# Patient Record
Sex: Female | Born: 1970 | Race: Black or African American | Hispanic: No | Marital: Single | Smoking: Former smoker
Health system: Southern US, Community
[De-identification: ages and names within clinical notes are randomized; demographics above are authoritative.]

## PROBLEM LIST (undated history)

## (undated) DIAGNOSIS — Z8719 Personal history of other diseases of the digestive system: Secondary | ICD-10-CM

## (undated) DIAGNOSIS — K219 Gastro-esophageal reflux disease without esophagitis: Secondary | ICD-10-CM

## (undated) DIAGNOSIS — E6609 Other obesity due to excess calories: Secondary | ICD-10-CM

## (undated) DIAGNOSIS — F32A Depression, unspecified: Secondary | ICD-10-CM

## (undated) DIAGNOSIS — R519 Headache, unspecified: Secondary | ICD-10-CM

## (undated) DIAGNOSIS — Z8679 Personal history of other diseases of the circulatory system: Secondary | ICD-10-CM

## (undated) DIAGNOSIS — Z8711 Personal history of peptic ulcer disease: Secondary | ICD-10-CM

## (undated) DIAGNOSIS — F419 Anxiety disorder, unspecified: Secondary | ICD-10-CM

## (undated) DIAGNOSIS — E559 Vitamin D deficiency, unspecified: Secondary | ICD-10-CM

## (undated) DIAGNOSIS — J302 Other seasonal allergic rhinitis: Secondary | ICD-10-CM

## (undated) DIAGNOSIS — F329 Major depressive disorder, single episode, unspecified: Secondary | ICD-10-CM

## (undated) DIAGNOSIS — J45909 Unspecified asthma, uncomplicated: Secondary | ICD-10-CM

## (undated) DIAGNOSIS — F321 Major depressive disorder, single episode, moderate: Secondary | ICD-10-CM

## (undated) HISTORY — DX: Gastro-esophageal reflux disease without esophagitis: K21.9

## (undated) HISTORY — DX: Other seasonal allergic rhinitis: J30.2

## (undated) HISTORY — DX: Other obesity due to excess calories: E66.09

## (undated) HISTORY — DX: Unspecified asthma, uncomplicated: J45.909

## (undated) HISTORY — DX: Personal history of other diseases of the circulatory system: Z86.79

## (undated) HISTORY — DX: Vitamin D deficiency, unspecified: E55.9

## (undated) HISTORY — DX: Depression, unspecified: F32.A

## (undated) HISTORY — DX: Personal history of peptic ulcer disease: Z87.11

## (undated) HISTORY — PX: TOTAL ABDOMINAL HYSTERECTOMY: SHX209

## (undated) HISTORY — DX: Headache, unspecified: R51.9

## (undated) HISTORY — DX: Anxiety disorder, unspecified: F41.9

## (undated) HISTORY — DX: Major depressive disorder, single episode, moderate: F32.1

## (undated) HISTORY — PX: TUBAL LIGATION: SHX77

---
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
## 2018-05-27 ENCOUNTER — Ambulatory Visit: Payer: Self-pay | Admitting: Nurse Practitioner

## 2018-05-27 ENCOUNTER — Encounter: Payer: Self-pay | Admitting: Nurse Practitioner

## 2018-05-27 VITALS — BP 110/80 | HR 78 | Temp 99.3°F | Wt 191.2 lb

## 2018-05-27 DIAGNOSIS — N3001 Acute cystitis with hematuria: Secondary | ICD-10-CM

## 2018-05-27 DIAGNOSIS — R8290 Unspecified abnormal findings in urine: Secondary | ICD-10-CM

## 2018-05-27 LAB — POC URINALSYSI DIPSTICK (AUTOMATED)
Bilirubin, UA: NEGATIVE
Glucose, UA: NEGATIVE
KETONES UA: NEGATIVE
PH UA: 6 (ref 5.0–8.0)
PROTEIN UA: NEGATIVE
Urobilinogen, UA: 0.2 E.U./dL

## 2018-05-27 MED ORDER — PHENAZOPYRIDINE HCL 100 MG PO TABS: 100.0000 mg | ORAL_TABLET | Freq: Three times a day (TID) | ORAL | 0 refills | Status: AC | PRN

## 2018-05-27 MED ORDER — NITROFURANTOIN MONOHYD MACRO 100 MG PO CAPS: 100.0000 mg | ORAL_CAPSULE | Freq: Two times a day (BID) | ORAL | 0 refills | Status: AC

## 2018-05-27 NOTE — Progress Notes (Signed)
Subjective:    Rose Douglas is a 47 y.o. female who complains of burning with urination, frequency, hematuria, hesitancy, nausea, suprapubic pressure and urgency for 5 days.  Patient also complains of back pain. Patient denies congestion, cough, fever, headache, stomach ache and vaginal discharge.  Patient states she did have sexual intercourse preceding the onset of her symptoms.  Patient does not have a history of recurrent UTI.  Patient does not have a history of pyelonephritis.  The patient states she is taken a medicine similar to Azo, she could not recall the name, and ibuprofen for pain.  The patient states that medicine has helped.   The following portions of the patient's history were reviewed and updated as appropriate: allergies, current medications and past medical history.  Review of Systems Constitutional: positive for anorexia, chills and fatigue, negative for fevers, malaise and sweats Ears, nose, mouth, throat, and face: negative Respiratory: negative Cardiovascular: negative Gastrointestinal: positive for nausea, negative for abdominal pain, constipation, diarrhea and vomiting Genitourinary:positive for dysuria, frequency, hematuria and hesitancy, negative for vaginal discharge    Objective:    BP 110/80   Pulse 78   Temp 99.3 F (37.4 C)   Wt 191 lb 3.2 oz (86.7 kg)   SpO2 98%  General: alert, cooperative and no distress  Abdomen: soft, non-tender, without masses or organomegaly in the entire abdomen  Back: back muscles are full ROM, CVA tenderness absent  GU: defer exam   Laboratory:  Urine dipstick shows sp gravity 1.030, negative for glucose, trace for hemoglobin, negative for ketones, large for leukocyte esterase, trace for nitrites, negative for protein and 0.2 for urobilinogen.   Micro exam: not done.    Assessment:    Acute Cystitis with Hematuria    Plan:  Exam findings, diagnosis etiology and medication use and indications reviewed with  patient. Follow- Up and discharge instructions provided. No emergent/urgent issues found on exam.  Patient verbalized understanding of information provided and agrees with plan of care (POC), all questions answered.  Acute Cystitis with Hematuria -Nitrofurantoin 100mg  twice daily for 5 days. -Pyridium 100mg  three times daily for dysuria. -Ibuprofen for pain, fever or discomfort. -Increase fluids. -Avoid caffeine, teas, sodas and coffee. -Toileting schedule -Avoid tight fitting pants. -Follow up if symptoms do not improve. Meds ordered this encounter  Medications  . nitrofurantoin, macrocrystal-monohydrate, (MACROBID) 100 MG capsule    Sig: Take 1 capsule (100 mg total) by mouth 2 (two) times daily for 5 days.    Dispense:  10 capsule    Refill:  0    Order Specific Question:   Supervising Provider    Answer:   Stacie GlazeJENKINS, JOHN E [[id number]]  . phenazopyridine (PYRIDIUM) 100 MG tablet    Sig: Take 1 tablet (100 mg total) by mouth 3 (three) times daily as needed for up to 3 days for pain.    Dispense:  10 tablet    Refill:  0    Order Specific Question:   Supervising Provider    Answer:   Stacie GlazeJENKINS, JOHN E [phone][[id number]]

## 2018-05-27 NOTE — Patient Instructions (Signed)

## 2018-06-10 ENCOUNTER — Other Ambulatory Visit: Payer: Self-pay | Admitting: Nurse Practitioner

## 2018-06-10 MED ORDER — CIPROFLOXACIN HCL 250 MG PO TABS: 250.0000 mg | ORAL_TABLET | Freq: Two times a day (BID) | ORAL | 0 refills | Status: AC

## 2018-06-10 NOTE — Progress Notes (Signed)
Patient stating she still has UTI symptoms.  Informed patient we will send in another prescription for her symptoms.  Will call in Cipro 250 mg twice daily for 3 days.

## 2018-11-28 ENCOUNTER — Other Ambulatory Visit: Payer: Self-pay

## 2018-11-28 ENCOUNTER — Encounter (HOSPITAL_COMMUNITY): Payer: Self-pay

## 2018-11-28 ENCOUNTER — Emergency Department (HOSPITAL_COMMUNITY): Payer: 59

## 2018-11-28 ENCOUNTER — Emergency Department (HOSPITAL_COMMUNITY)
Admission: EM | Admit: 2018-11-28 | Discharge: 2018-11-28 | Disposition: A | Discharge status: Home / Self Care | Payer: 59 | Attending: Emergency Medicine | Admitting: Emergency Medicine

## 2018-11-28 DIAGNOSIS — N390 Urinary tract infection, site not specified: Secondary | ICD-10-CM

## 2018-11-28 DIAGNOSIS — F17210 Nicotine dependence, cigarettes, uncomplicated: Secondary | ICD-10-CM | POA: Diagnosis not present

## 2018-11-28 DIAGNOSIS — R079 Chest pain, unspecified: Secondary | ICD-10-CM | POA: Diagnosis not present

## 2018-11-28 LAB — BASIC METABOLIC PANEL
Anion gap: 8 (ref 5–15)
BUN: 11 mg/dL (ref 6–20)
CO2: 22 mmol/L (ref 22–32)
Calcium: 8.9 mg/dL (ref 8.9–10.3)
Chloride: 105 mmol/L (ref 98–111)
Creatinine, Ser: 0.85 mg/dL (ref 0.44–1.00)
GFR calc Af Amer: >60 mL/min (ref >60)
GFR calc non Af Amer: >60 mL/min (ref >60)
GLUCOSE: 90 mg/dL (ref 70–99)
Potassium: 4.3 mmol/L (ref 3.5–5.1)
Sodium: 135 mmol/L (ref 135–145)

## 2018-11-28 LAB — URINALYSIS, ROUTINE W REFLEX MICROSCOPIC
BILIRUBIN URINE: NEGATIVE
Glucose, UA: NEGATIVE mg/dL
Hgb urine dipstick: NEGATIVE
Ketones, ur: NEGATIVE mg/dL
Nitrite: NEGATIVE
Protein, ur: NEGATIVE mg/dL
Specific Gravity, Urine: 1.009 (ref 1.005–1.030)
pH: 6 (ref 5.0–8.0)

## 2018-11-28 LAB — CBC
HCT: 41.7 % (ref 36.0–46.0)
Hemoglobin: 13.2 g/dL (ref 12.0–15.0)
MCH: 23.5 pg — ABNORMAL LOW (ref 26.0–34.0)
MCHC: 31.7 g/dL (ref 30.0–36.0)
MCV: 74.3 fL — ABNORMAL LOW (ref 80.0–100.0)
Platelets: 207 10*3/uL (ref 150–400)
RBC: 5.61 MIL/uL — ABNORMAL HIGH (ref 3.87–5.11)
RDW: 15.1 % (ref 11.5–15.5)
WBC: 6.4 10*3/uL (ref 4.0–10.5)
nRBC: 0 % (ref 0.0–0.2)

## 2018-11-28 LAB — I-STAT BETA HCG BLOOD, ED (MC, WL, AP ONLY): I-stat hCG, quantitative: <5 m[IU]/mL (ref <5)

## 2018-11-28 LAB — I-STAT TROPONIN, ED: Troponin i, poc: 0 ng/mL (ref 0.00–0.08)

## 2018-11-28 MED ORDER — CEPHALEXIN 500 MG PO CAPS: 500.0000 mg | ORAL_CAPSULE | Freq: Four times a day (QID) | ORAL | 0 refills | Status: DC

## 2018-11-28 NOTE — ED Provider Notes (Signed)
[facility] EMERGENCY [facility]PARTMENT Provider Note   CSN: [id number] Arrival date & time: 11/28/18  1156     History   Chief Complaint Chief Complaint  Patient presents with  . Chest Pain    HPI Rose Douglas is a 48 y.o. female.  The history is provided by the patient. No language interpreter was used.  Chest Pain  Pain location:  Substernal area Pain quality: sharp   Pain radiates to:  Does not radiate Onset quality:  Sudden Progression:  Partially resolved Chronicity:  New Context: not breathing   Relieved by:  Nothing Associated symptoms: no abdominal pain   Pt also complains of burning with urination.  Pt thinks she may have a uti.   Pt states she has had a sinus infection and congestion   History reviewed. No pertinent past medical history.  There are no active problems to display for this patient.   History reviewed. No pertinent surgical history.   OB History   No obstetric history on file.      Home Medications    Prior to Admission medications   Not on File    Family History History reviewed. No pertinent family history.  Social History Social History   Tobacco Use  . Smoking status: Current Some Day Smoker    Types: Cigarettes  . Smokeless tobacco: Never Used  Substance Use Topics  . Alcohol use: Never    Frequency: Never  . Drug use: Never     Allergies   Patient has no known allergies.   Review of Systems Review of Systems  Cardiovascular: Positive for chest pain.  Gastrointestinal: Negative for abdominal pain.  All other systems reviewed and are negative.    Physical Exam Updated Vital Signs BP 116/87 (BP Location: Right Arm)   Pulse 68   Temp 98.3 F (36.8 C) (Oral)   Resp 16   SpO2 100%   Physical Exam Vitals signs and nursing note reviewed.  Constitutional:      Appearance: She is well-developed.  HENT:     Head: Normocephalic.  Eyes:     Pupils: Pupils are equal, round, and reactive to light.   Neck:     Musculoskeletal: Normal range of motion.  Cardiovascular:     Rate and Rhythm: Normal rate.     Heart sounds: Normal heart sounds.  Pulmonary:     Effort: Pulmonary effort is normal.     Breath sounds: Normal breath sounds.  Abdominal:     Palpations: Abdomen is soft.  Musculoskeletal: Normal range of motion.  Skin:    General: Skin is warm.  Neurological:     General: No focal deficit present.     Mental Status: She is alert.      ED Treatments / Results  Labs (all labs ordered are listed, but only abnormal results are displayed) Labs Reviewed  CBC - Abnormal; Notable for the following components:      Result Value   RBC 5.61 (*)    MCV 74.3 (*)    MCH 23.5 (*)    All other components within normal limits  URINALYSIS, ROUTINE W REFLEX MICROSCOPIC - Abnormal; Notable for the following components:   APPearance HAZY (*)    Leukocytes, UA LARGE (*)    Bacteria, UA FEW (*)    All other components within normal limits  BASIC METABOLIC PANEL  I-STAT TROPONIN, ED  I-STAT BETA HCG BLOOD, ED ([facility], [facility], [facility] ONLY)    EKG EKG Interpretation  Date/Time:  Thursday November 28 2018 12:06:40 EST Ventricular Rate:  65 PR Interval:  134 QRS Duration: 90 QT Interval:  396 QTC Calculation: 411 R Axis:   101 Text Interpretation:  Normal sinus rhythm Rightward axis Borderline ECG No previous ECGs available Confirmed by Alvira MondaySchlossman, Erin ([id number]) on 11/28/2018 2:52:57 PM   Radiology Dg Chest 2 View  Result Date: 11/28/2018 CLINICAL DATA:  Chest tightness EXAM: CHEST - 2 VIEW COMPARISON:  None. FINDINGS: Lungs are clear. Heart size and pulmonary vascularity are normal. No adenopathy. No pneumothorax. No bone lesions. There is upper lumbar levoscoliosis. IMPRESSION: No edema or consolidation. Electronically Signed   By: Bretta BangWilliam  Woodruff III M.D.   On: 11/28/2018 12:55    Procedures Procedures (including critical care time)  Medications Ordered in ED Medications - No data to  display   Initial Impression / Assessment and Plan / ED Course  I have reviewed the triage vital signs and the nursing notes.  Pertinent labs & imaging results that were available during my care of the patient were reviewed by me and considered in my medical decision making (see chart for details).     MDM  Ua 11-20 wbc EKG normal. Chest xray normal troponin negative.  I doubt cardiac or pulmonary disease.  Pt given rx for clindamycin.  Pt advised to see her MD for recheck   Final Clinical Impressions(s) / ED Diagnoses   Final diagnoses:  Nonspecific chest pain  Urinary tract infection without hematuria, site unspecified    ED Discharge Orders    None    An After Visit Summary was printed and given to the patient.    Elson AreasSofia, Leslie K, PA-C 11/28/18 1512    Alvira MondaySchlossman, Erin, MD 11/29/18 [phone]

## 2018-11-28 NOTE — ED Triage Notes (Signed)
Pt endorses central chest pain with right arm pain that began while working this morning. Denies any associated sx. Had recent sinus infection and states "I am still dealing with that" VSS.

## 2018-11-28 NOTE — ED Notes (Signed)
Declined W/C at D/C and was escorted to lobby by RN. 

## 2018-11-28 NOTE — Discharge Instructions (Addendum)
See your Physician for recheck.  °

## 2018-12-10 ENCOUNTER — Encounter: Payer: Self-pay | Admitting: Medical

## 2018-12-10 ENCOUNTER — Ambulatory Visit (INDEPENDENT_AMBULATORY_CARE_PROVIDER_SITE_OTHER): Payer: 59 | Admitting: Medical

## 2018-12-10 VITALS — BP 132/90 | HR 85 | Wt 200.6 lb

## 2018-12-10 DIAGNOSIS — A599 Trichomoniasis, unspecified: Secondary | ICD-10-CM | POA: Diagnosis not present

## 2018-12-10 DIAGNOSIS — Z01419 Encounter for gynecological examination (general) (routine) without abnormal findings: Secondary | ICD-10-CM

## 2018-12-10 DIAGNOSIS — N898 Other specified noninflammatory disorders of vagina: Secondary | ICD-10-CM

## 2018-12-10 DIAGNOSIS — Z113 Encounter for screening for infections with a predominantly sexual mode of transmission: Secondary | ICD-10-CM

## 2018-12-10 LAB — POCT URINALYSIS DIP (DEVICE)
Bilirubin Urine: NEGATIVE
Glucose, UA: NEGATIVE mg/dL
Ketones, ur: NEGATIVE mg/dL
Nitrite: NEGATIVE
Protein, ur: NEGATIVE mg/dL
Specific Gravity, Urine: 1.015 (ref 1.005–1.030)
Urobilinogen, UA: 0.2 mg/dL (ref 0.0–1.0)
pH: 6.5 (ref 5.0–8.0)

## 2018-12-10 NOTE — Progress Notes (Signed)
Subjective:    Rose Douglas is a 48 y.o. female who presents for an annual exam. The patient has no complaints today. The patient is sexually active. GYN screening history: last pap: approximate date 2016 and was normal. The patient wears seatbelts: yes. The patient participates in regular exercise: yes. Has the patient ever been transfused or tattooed?: no. The patient reports that there is not domestic violence in her life.   Menstrual History: OB History    Gravida  6   Para  4   Term  4   Preterm  0   AB  2   Living  4     SAB  1   TAB  1   Ectopic  0   Multiple  0   Live Births  4           Menarche age: 82 No LMP recorded. Patient has had a hysterectomy.    The following portions of the patient's history were reviewed and updated as appropriate: allergies, current medications, past family history, past medical history, past social history, past surgical history and problem list.  Review of Systems Review of Systems  Constitutional: Negative for fever.  Gastrointestinal: Negative for abdominal pain, nausea and vomiting.  Genitourinary: Negative for dysuria, frequency and urgency.       Neg - vaginal bleeding, discharge    Objective:   Physical Exam  Nursing note and vitals reviewed. Constitutional: She is oriented to person, place, and time. She appears well-developed and well-nourished. No distress.  HENT:  Head: Normocephalic and atraumatic.  Cardiovascular: Normal rate, regular rhythm and normal heart sounds.  No murmur heard. Respiratory: Effort normal and breath sounds normal. No respiratory distress. She has no wheezes.  GI: Soft. Bowel sounds are normal. She exhibits no distension and no mass. There is no abdominal tenderness. There is no rebound and no guarding.  Genitourinary:    No vaginal discharge or bleeding.  No bleeding in the vagina.    Genitourinary Comments: Cervix: surgically absent   Neurological: She is alert and oriented to  person, place, and time.  Skin: Skin is warm and dry. No erythema.  Psychiatric: She has a normal mood and affect.   Results for orders placed or performed in visit on 12/10/18 (from the past 24 hour(s))  POCT urinalysis dip (device)     Status: Abnormal   Collection Time: 12/10/18  3:45 PM  Result Value Ref Range   Glucose, UA NEGATIVE NEGATIVE mg/dL   Bilirubin Urine NEGATIVE NEGATIVE   Ketones, ur NEGATIVE NEGATIVE mg/dL   Specific Gravity, Urine 1.015 1.005 - 1.030   Hgb urine dipstick TRACE (A) NEGATIVE   pH 6.5 5.0 - 8.0   Protein, ur NEGATIVE NEGATIVE mg/dL   Urobilinogen, UA 0.2 0.0 - 1.0 mg/dL   Nitrite NEGATIVE NEGATIVE   Leukocytes, UA SMALL (A) NEGATIVE      Assessment:   Healthy female exam  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 Patient advised that pap smear are no longer needed secondary to hysterectomy   Marny Lowenstein, PA-C 12/10/2018 3:26 PM

## 2018-12-11 LAB — HEPATITIS C ANTIBODY: Hep C Virus Ab: <0.1 s/co ratio (ref 0.0–0.9)

## 2018-12-11 LAB — URINE CULTURE: Organism ID, Bacteria: NO GROWTH

## 2018-12-11 LAB — CERVICOVAGINAL ANCILLARY ONLY
Bacterial vaginitis: NEGATIVE
Candida vaginitis: NEGATIVE
Chlamydia: NEGATIVE
Neisseria Gonorrhea: NEGATIVE
Trichomonas: POSITIVE — AB

## 2018-12-11 LAB — HIV ANTIBODY (ROUTINE TESTING W REFLEX): HIV SCREEN 4TH GENERATION: NONREACTIVE

## 2018-12-11 LAB — RPR: RPR Ser Ql: NONREACTIVE

## 2018-12-11 LAB — HEPATITIS B SURFACE ANTIGEN: Hepatitis B Surface Ag: NEGATIVE

## 2018-12-12 ENCOUNTER — Encounter: Payer: Self-pay | Admitting: Medical

## 2018-12-12 MED ORDER — METRONIDAZOLE 500 MG PO TABS: 2000.0000 mg | ORAL_TABLET | Freq: Once | ORAL | 0 refills | Status: AC

## 2018-12-12 NOTE — Patient Instructions (Signed)

## 2018-12-12 NOTE — Addendum Note (Signed)
Addended by: Marny Lowenstein on: 12/12/2018 02:52 PM   Modules accepted: Orders

## 2018-12-31 ENCOUNTER — Telehealth: Payer: Self-pay

## 2018-12-31 MED ORDER — METRONIDAZOLE 500 MG PO TABS: 2000.0000 mg | ORAL_TABLET | Freq: Once | ORAL | 0 refills | Status: AC

## 2018-12-31 NOTE — Telephone Encounter (Signed)
Pt called requesting an Rx for Trich.  I informed pt that the medication was sent on 12/12/18.  Pt stated that she never got the medication.  I informed pt that I will resend the medication to her [facility] on [address].  Pt stated understanding.

## 2019-01-07 ENCOUNTER — Ambulatory Visit: Payer: 59

## 2019-08-16 ENCOUNTER — Encounter (HOSPITAL_COMMUNITY): Payer: Self-pay | Admitting: *Deleted

## 2019-08-16 ENCOUNTER — Emergency Department (HOSPITAL_COMMUNITY)
Admission: EM | Admit: 2019-08-16 | Discharge: 2019-08-16 | Disposition: A | Discharge status: Home / Self Care | Payer: 59 | Attending: Emergency Medicine | Admitting: Emergency Medicine

## 2019-08-16 ENCOUNTER — Other Ambulatory Visit: Payer: Self-pay

## 2019-08-16 DIAGNOSIS — F17210 Nicotine dependence, cigarettes, uncomplicated: Secondary | ICD-10-CM | POA: Diagnosis not present

## 2019-08-16 DIAGNOSIS — R0982 Postnasal drip: Secondary | ICD-10-CM | POA: Insufficient documentation

## 2019-08-16 DIAGNOSIS — Z20828 Contact with and (suspected) exposure to other viral communicable diseases: Secondary | ICD-10-CM | POA: Diagnosis not present

## 2019-08-16 DIAGNOSIS — J0100 Acute maxillary sinusitis, unspecified: Secondary | ICD-10-CM

## 2019-08-16 DIAGNOSIS — R51 Headache: Secondary | ICD-10-CM | POA: Diagnosis present

## 2019-08-16 LAB — SARS CORONAVIRUS 2 (TAT 6-24 HRS): SARS Coronavirus 2: NEGATIVE

## 2019-08-16 MED ORDER — AMOXICILLIN-POT CLAVULANATE 875-125 MG PO TABS: 1.0000 | ORAL_TABLET | Freq: Two times a day (BID) | ORAL | 0 refills | Status: DC

## 2019-08-16 MED ORDER — AMOXICILLIN-POT CLAVULANATE 875-125 MG PO TABS
1.0000 | ORAL_TABLET | Freq: Once | ORAL | Status: AC
  Administered 2019-08-16: 12:00:00 1 via ORAL
  Filled 2019-08-16: qty 1

## 2019-08-16 NOTE — ED Triage Notes (Signed)
C./o headache onset tues , today choking from nasal drainage

## 2019-08-16 NOTE — Discharge Instructions (Addendum)
You are being tested for COVID-19.  Please stay at home and quarantine herself until you know the results are negative.  Take all medication as prescribed.  Return if you have any new or worsening symptoms.

## 2019-08-16 NOTE — ED Provider Notes (Signed)
[facility] EMERGENCY DEPARTMENT Provider Note   CSN: [id number] Arrival date & time: 08/16/19  1019     History   Chief Complaint Chief Complaint  Patient presents with  . Headache    HPI Rose Douglas is a 48 y.o. female.     48 year old female with no significant past medical history presenting to the emergency department for sinus pressure and drainage for the last 4 to 5 days which is worsening.  She reports that she has been having white and bloody sinus drainage daily.  Reports that she feels like.  Can do her so she came to the emergency department.  She denies any fever but reports she has been taking aspirin for body aches and headache.  Denies any known sick contact.  Denies any trouble breathing or swallowing     History reviewed. No pertinent past medical history.  There are no active problems to display for this patient.   Past Surgical History:  Procedure Laterality Date  . TUBAL LIGATION       OB History    Gravida  6   Para  4   Term  4   Preterm  0   AB  2   Living  4     SAB  1   TAB  1   Ectopic  0   Multiple  0   Live Births  4            Home Medications    Prior to Admission medications   Medication Sig Start Date End Date Taking? Authorizing Provider  amoxicillin-clavulanate (AUGMENTIN) 875-125 MG tablet Take 1 tablet by mouth every 12 (twelve) hours. 08/16/19   Alveria Apley, PA-C    Family History No family history on file.  Social History Social History   Tobacco Use  . Smoking status: Current Some Day Smoker    Types: Cigarettes  . Smokeless tobacco: Never Used  Substance Use Topics  . Alcohol use: Yes    Frequency: Never  . Drug use: Never     Allergies   Latex   Review of Systems Review of Systems  Constitutional: Negative for chills and fever.  HENT: Positive for congestion, ear pain, postnasal drip, rhinorrhea, sinus pressure, sinus pain and sore throat. Negative for  dental problem, drooling, ear discharge, facial swelling, hearing loss, mouth sores, nosebleeds, sneezing, tinnitus, trouble swallowing and voice change.   Respiratory: Negative for cough and shortness of breath.   Cardiovascular: Negative for chest pain.  Gastrointestinal: Negative for abdominal pain, nausea and vomiting.  Genitourinary: Negative for dysuria.  Musculoskeletal: Negative for myalgias.  Skin: Negative for rash.  Allergic/Immunologic: Positive for environmental allergies.  Neurological: Positive for headaches. Negative for dizziness, weakness and light-headedness.  All other systems reviewed and are negative.    Physical Exam Updated Vital Signs BP (!) 156/94 (BP Location: Left Arm)   Pulse 89   Temp 98.8 F (37.1 C) (Oral)   Resp 18   Ht 5' 6.5" (1.689 m)   Wt 86.2 kg   SpO2 100%   BMI 30.21 kg/m   Physical Exam Vitals signs and nursing note reviewed.  Constitutional:      Appearance: Normal appearance.  HENT:     Head: Normocephalic and atraumatic.     Right Ear: No drainage or swelling. No middle ear effusion. Tympanic membrane is bulging. Tympanic membrane is not injected, scarred, perforated, erythematous or retracted.     Left Ear: No drainage or  swelling.  No middle ear effusion. Tympanic membrane is bulging. Tympanic membrane is not injected, scarred, perforated, erythematous or retracted.     Nose: Nose normal.     Mouth/Throat:     Lips: Pink.     Mouth: Mucous membranes are moist. No injury or oral lesions.     Dentition: Normal dentition. No dental tenderness or gingival swelling.     Pharynx: Oropharynx is clear. Uvula midline. Uvula swelling (Mild) present. No pharyngeal swelling, oropharyngeal exudate or posterior oropharyngeal erythema.     Comments: Patient has tenderness to percussion of the maxillary sinus. Eyes:     Extraocular Movements: Extraocular movements intact.     Conjunctiva/sclera: Conjunctivae normal.  Cardiovascular:     Rate  and Rhythm: Normal rate and regular rhythm.  Pulmonary:     Effort: Pulmonary effort is normal.  Skin:    General: Skin is warm and dry.  Neurological:     Mental Status: She is alert.  Psychiatric:        Mood and Affect: Mood normal.      ED Treatments / Results  Labs (all labs ordered are listed, but only abnormal results are displayed) Labs Reviewed  NOVEL CORONAVIRUS, NAA (HOSP ORDER, SEND-OUT TO REF LAB; TAT 18-24 HRS)    EKG None  Radiology No results found.  Procedures Procedures (including critical care time)  Medications Ordered in ED Medications  amoxicillin-clavulanate (AUGMENTIN) 875-125 MG per tablet 1 tablet (has no administration in time range)     Initial Impression / Assessment and Plan / ED Course  I have reviewed the triage vital signs and the nursing notes.  Pertinent labs & imaging results that were available during my care of the patient were reviewed by me and considered in my medical decision making (see chart for details).        Based on review of vitals, medical screening exam, lab work and/or imaging, there does not appear to be an acute, emergent etiology for the patient's symptoms. Counseled pt on good return precautions and encouraged both PCP and ED follow-up as needed.  Prior to discharge, I also discussed incidental imaging findings with patient in detail and advised appropriate, recommended follow-up in detail.  Clinical Impression: 1. Acute non-recurrent maxillary sinusitis   2. Postnasal drip     Disposition: Discharge  Prior to providing a prescription for a controlled substance, I independently reviewed the patient's recent prescription history on the [place] Controlled Substance Reporting System. The patient had no recent or regular prescriptions and was deemed appropriate for a brief, less than 3 day prescription of narcotic for acute analgesia.  This note was prepared with assistance of Manufacturing engineer. Occasional wrong-word or sound-a-like substitutions may have occurred due to the inherent limitations of voice recognition software.   Final Clinical Impressions(s) / ED Diagnoses   Final diagnoses:  Acute non-recurrent maxillary sinusitis  Postnasal drip    ED Discharge Orders         Ordered    amoxicillin-clavulanate (AUGMENTIN) 875-125 MG tablet  Every 12 hours     08/16/19 1133           Jeral Pinch 08/16/19 1134    Arby Barrette, MD 08/17/19 1146

## 2019-10-27 ENCOUNTER — Other Ambulatory Visit: Payer: Self-pay

## 2019-10-27 ENCOUNTER — Ambulatory Visit
Admission: EM | Admit: 2019-10-27 | Discharge: 2019-10-27 | Disposition: A | Discharge status: Home / Self Care | Payer: 59 | Attending: Physician Assistant | Admitting: Physician Assistant

## 2019-10-27 DIAGNOSIS — B9689 Other specified bacterial agents as the cause of diseases classified elsewhere: Secondary | ICD-10-CM | POA: Diagnosis not present

## 2019-10-27 DIAGNOSIS — R0982 Postnasal drip: Secondary | ICD-10-CM

## 2019-10-27 DIAGNOSIS — J0140 Acute pansinusitis, unspecified: Secondary | ICD-10-CM

## 2019-10-27 MED ORDER — DOXYCYCLINE HYCLATE 100 MG PO CAPS: 100.0000 mg | ORAL_CAPSULE | Freq: Two times a day (BID) | ORAL | 0 refills | Status: DC

## 2019-10-27 MED ORDER — PREDNISONE 50 MG PO TABS: 50.0000 mg | ORAL_TABLET | Freq: Every day | ORAL | 0 refills | Status: DC

## 2019-10-27 MED ORDER — IPRATROPIUM BROMIDE 0.06 % NA SOLN: 2.0000 | Freq: Four times a day (QID) | NASAL | 0 refills | Status: DC

## 2019-10-27 MED ORDER — FLUCONAZOLE 150 MG PO TABS: 150.0000 mg | ORAL_TABLET | Freq: Every day | ORAL | 0 refills | Status: DC

## 2019-10-27 NOTE — ED Provider Notes (Signed)
[facility]    CSN: [id number] Arrival date & time: 10/27/19  0932      History   Chief Complaint Chief Complaint  Patient presents with  . Facial Pain    HPI Rose Douglas is a 48 y.o. female.   48 year old female comes in for 10-day history of URI symptoms. Has had rhinorrhea, nasal congestion, postnasal drip, productive cough, sinus pressure.  Denies fever, chills, body aches.  Denies abdominal pain, nausea, vomiting, diarrhea.  Denies shortness of breath, loss of taste or smell.  Had Covid testing 3 days ago, awaiting results.  She has been using saline wash, Mucinex with minimal relief.  Former smoker.  Patient states has been getting frequent sinusitis, third time this year.  Last treated with Augmentin 3 months ago.     History reviewed. No pertinent past medical history.  There are no active problems to display for this patient.   Past Surgical History:  Procedure Laterality Date  . TUBAL LIGATION      OB History    Gravida  6   Para  4   Term  4   Preterm  0   AB  2   Living  4     SAB  1   TAB  1   Ectopic  0   Multiple  0   Live Births  4            Home Medications    Prior to Admission medications   Medication Sig Start Date End Date Taking? Authorizing Provider  doxycycline (VIBRAMYCIN) 100 MG capsule Take 1 capsule (100 mg total) by mouth 2 (two) times daily. 10/27/19   Cathie Hoops, Amy V, PA-C  fluconazole (DIFLUCAN) 150 MG tablet Take 1 tablet (150 mg total) by mouth daily. Take second dose 72 hours later if symptoms still persists. 10/27/19   Cathie Hoops, Amy V, PA-C  ipratropium (ATROVENT) 0.06 % nasal spray Place 2 sprays into both nostrils 4 (four) times daily. 10/27/19   Cathie Hoops, Amy V, PA-C  predniSONE (DELTASONE) 50 MG tablet Take 1 tablet (50 mg total) by mouth daily with breakfast. 10/27/19   Belinda Fisher, PA-C    Family History No family history on file.  Social History Social History   Tobacco Use  . Smoking status: Former  Smoker    Types: Cigarettes  . Smokeless tobacco: Never Used  Substance Use Topics  . Alcohol use: Yes    Frequency: Never  . Drug use: Never     Allergies   Latex   Review of Systems Review of Systems  Reason unable to perform ROS: See HPI as above.     Physical Exam Triage Vital Signs ED Triage Vitals  Enc Vitals Group     BP 10/27/19 0945 134/86     Pulse Rate 10/27/19 0945 83     Resp 10/27/19 0945 18     Temp 10/27/19 0945 98.6 F (37 C)     Temp Source 10/27/19 0945 Oral     SpO2 10/27/19 0945 98 %     Weight --      Height --      Head Circumference --      Peak Flow --      Pain Score 10/27/19 0946 5     Pain Loc --      Pain Edu? --      Excl. in GC? --    No data found.  Updated Vital Signs BP 134/86 (BP  Location: Left Arm)   Pulse 83   Temp 98.6 F (37 C) (Oral)   Resp 18   SpO2 98%   Physical Exam Constitutional:      General: She is not in acute distress.    Appearance: Normal appearance. She is not ill-appearing, toxic-appearing or diaphoretic.  HENT:     Head: Normocephalic and atraumatic.     Right Ear: Tympanic membrane, ear canal and external ear normal. Tympanic membrane is not erythematous or bulging.     Left Ear: Tympanic membrane, ear canal and external ear normal. Tympanic membrane is not erythematous or bulging.     Nose:     Right Sinus: Maxillary sinus tenderness and frontal sinus tenderness present.     Left Sinus: Maxillary sinus tenderness and frontal sinus tenderness present.     Mouth/Throat:     Mouth: Mucous membranes are moist.     Pharynx: Oropharynx is clear. Uvula midline.     Comments: Post nasal drainage observed.  Neck:     Musculoskeletal: Normal range of motion and neck supple.  Cardiovascular:     Rate and Rhythm: Normal rate and regular rhythm.     Heart sounds: Normal heart sounds. No murmur. No friction rub. No gallop.   Pulmonary:     Effort: Pulmonary effort is normal. No accessory muscle usage,  prolonged expiration, respiratory distress or retractions.     Comments: Speaking in full sentences without difficulty. Lungs clear to auscultation without adventitious lung sounds. Neurological:     General: No focal deficit present.     Mental Status: She is alert and oriented to person, place, and time.    UC Treatments / Results  Labs (all labs ordered are listed, but only abnormal results are displayed) Labs Reviewed - No data to display  EKG   Radiology No results found.  Procedures Procedures (including critical care time)  Medications Ordered in UC Medications - No data to display  Initial Impression / Assessment and Plan / UC Course  I have reviewed the triage vital signs and the nursing notes.  Pertinent labs & imaging results that were available during my care of the patient were reviewed by me and considered in my medical decision making (see chart for details).    Patient to continue to quarantine until testing results return.  Will cover for bacterial sinusitis given 10-day history, excessive postnasal drainage despite sinus rinse.  Will switch to doxycycline, and start prednisone for sinus pressure.  Other symptomatic treatment discussed.  Return precautions given.  Given frequent sinusitis, also to follow-up with ENT if continues with recurrent symptoms.  Final Clinical Impressions(s) / UC Diagnoses   Final diagnoses:  Acute non-recurrent pansinusitis   ED Prescriptions    Medication Sig Dispense Auth. Provider   doxycycline (VIBRAMYCIN) 100 MG capsule Take 1 capsule (100 mg total) by mouth 2 (two) times daily. 20 capsule Yu, Amy V, PA-C   predniSONE (DELTASONE) 50 MG tablet Take 1 tablet (50 mg total) by mouth daily with breakfast. 5 tablet Yu, Amy V, PA-C   ipratropium (ATROVENT) 0.06 % nasal spray Place 2 sprays into both nostrils 4 (four) times daily. 15 mL Yu, Amy V, PA-C   fluconazole (DIFLUCAN) 150 MG tablet Take 1 tablet (150 mg total) by mouth daily.  Take second dose 72 hours later if symptoms still persists. 2 tablet Ok Edwards, PA-C     PDMP not reviewed this encounter.   Ok Edwards, PA-C 10/27/19 1038

## 2019-10-27 NOTE — Discharge Instructions (Signed)
Please continue to quarantine until your testing results. Start doxycycline and prednisone as directed. Can add on atrovent to help with drainage if needed. If experiencing shortness of breath, trouble breathing, go to the emergency department for further evaluation needed. Follow up with ENT if continues with frequent sinus infections.

## 2019-10-27 NOTE — ED Triage Notes (Signed)
Pt c/o sinus pressure and productive cough for over a week now.

## 2019-12-10 ENCOUNTER — Ambulatory Visit (INDEPENDENT_AMBULATORY_CARE_PROVIDER_SITE_OTHER): Payer: 59 | Admitting: Nurse Practitioner

## 2019-12-10 DIAGNOSIS — Z130 Encounter for screening for diseases of the blood and blood-forming organs and certain disorders involving the immune mechanism: Secondary | ICD-10-CM

## 2019-12-10 DIAGNOSIS — Z13220 Encounter for screening for lipoid disorders: Secondary | ICD-10-CM

## 2019-12-10 DIAGNOSIS — Z13228 Encounter for screening for other metabolic disorders: Secondary | ICD-10-CM

## 2019-12-10 DIAGNOSIS — Z833 Family history of diabetes mellitus: Secondary | ICD-10-CM

## 2019-12-10 DIAGNOSIS — J329 Chronic sinusitis, unspecified: Secondary | ICD-10-CM

## 2019-12-10 DIAGNOSIS — Z7689 Persons encountering health services in other specified circumstances: Secondary | ICD-10-CM

## 2019-12-10 NOTE — Progress Notes (Signed)
Virtual Visit via Telephone Note Due to national recommendations of social distancing due to [place] 19, telehealth visit is felt to be most appropriate for this patient at this time.  I discussed the limitations, risks, security and privacy concerns of performing an evaluation and management service by telephone and the availability of in person appointments. I also discussed with the patient that there may be a patient responsible charge related to this service. The patient expressed understanding and agreed to proceed.    I connected with Rose Douglas on 12/10/19  at   9:30 AM EST  EDT by telephone and verified that I am speaking with the correct person using two identifiers.   Consent I discussed the limitations, risks, security and privacy concerns of performing an evaluation and management service by telephone and the availability of in person appointments. I also discussed with the patient that there may be a patient responsible charge related to this service. The patient expressed understanding and agreed to proceed.   Location of Patient: Private Residence   Location of Provider: [place] participating in Telemedicine visit: Geryl Rankins FNP-BC [place]    History of Present Illness: Telemedicine visit for: [facility] Mammogram-Endorses tender left breast lump beneath the areola. She noticed it a year ago.  Feels more painful after she takes her bra off. Last mammogram 2017.  PAP smear-2019 reports as normal.   Endorses non tender lump above left eyebrow. Noticed it 6 months ago.   Requesting ENT referral for recurrent sinus infections. Notes 3 occurrences within 6 months. She does have a history of allergies but stopped taking zyrtec because of drowsiness.  Symptoms include bilateral ear pressure/pain, congestion, facial pain, headache, nasal congestion, post nasal drip, purulent nasal  discharge and sinus pressure with no fever, chills, night sweats or weight loss.   Past Medical History:  Diagnosis Date  . Anxiety   . Asthma   . Class 1 obesity due to excess calories in adult   . GERD (gastroesophageal reflux disease)   . History of stomach ulcers   . History of ventricular fibrillation    had total hysterectomy  . Moderate depressive disorder   . Seasonal allergies   . Vitamin D deficiency     Past Surgical History:  Procedure Laterality Date  . TOTAL ABDOMINAL HYSTERECTOMY    . TUBAL LIGATION      Family History  Problem Relation Age of Onset  . Lupus Mother   . Early death Mother   . Diabetes Maternal Grandmother   . Cancer Paternal Grandmother   . Cancer Paternal Grandfather   . Breast cancer Maternal Aunt   . Alcohol abuse Maternal Grandfather     Social History   Socioeconomic History  . Marital status: Unknown    Spouse name: Not on file  . Number of children: Not on file  . Years of education: Not on file  . Highest education level: Not on file  Occupational History  . Not on file  Tobacco Use  . Smoking status: Former Smoker    Types: Cigarettes  . Smokeless tobacco: Never Used  Substance and Sexual Activity  . Alcohol use: Yes  . Drug use: Never  . Sexual activity: Not on file  Other Topics Concern  . Not on file  Social History Narrative  . Not on file   Social Determinants of Health   Financial Resource Strain:   .  Difficulty of Paying Living Expenses: Not on file  Food Insecurity:   . Worried About Charity fundraiser in the Last Year: Not on file  . Ran Out of Food in the Last Year: Not on file  Transportation Needs:   . Lack of Transportation (Medical): Not on file  . Lack of Transportation (Non-Medical): Not on file  Physical Activity:   . Days of Exercise per Week: Not on file  . Minutes of Exercise per Session: Not on file  Stress:   . Feeling of Stress : Not on file  Social Connections:   . Frequency of  Communication with Friends and Family: Not on file  . Frequency of Social Gatherings with Friends and Family: Not on file  . Attends Religious Services: Not on file  . Active Member of Clubs or Organizations: Not on file  . Attends Archivist Meetings: Not on file  . Marital Status: Not on file     Observations/Objective: Awake, alert and oriented x 3   Review of Systems  Constitutional: Negative for fever, malaise/fatigue and weight loss.  HENT: Positive for congestion. Negative for nosebleeds.   Eyes: Negative.  Negative for blurred vision, double vision and photophobia.  Respiratory: Negative.  Negative for cough and shortness of breath.   Cardiovascular: Negative.  Negative for chest pain, palpitations and leg swelling.  Gastrointestinal: Negative.  Negative for heartburn, nausea and vomiting.  Musculoskeletal: Negative.  Negative for myalgias.  Neurological: Negative.  Negative for dizziness, focal weakness, seizures and headaches.  Endo/Heme/Allergies: Positive for environmental allergies.  Psychiatric/Behavioral: Negative.  Negative for suicidal ideas.    Assessment and Plan: [name] was seen today for establish care and referral.  Diagnoses and all orders for this visit:  Encounter to establish care  Recurrent sinus infections -     Ambulatory referral to ENT -     DG Sinus 1-2 Views; Future  Family history of diabetes mellitus (DM) -     Hemoglobin A1c  Lipid screening -     Lipid panel  Screening for metabolic disorder -     [id number]  Screening for deficiency anemia -     CBC     Follow Up Instructions Return if symptoms worsen or fail to improve.     I discussed the assessment and treatment plan with the patient. The patient was provided an opportunity to ask questions and all were answered. The patient agreed with the plan and demonstrated an understanding of the instructions.   The patient was advised to call back or seek an in-person  evaluation if the symptoms worsen or if the condition fails to improve as anticipated.  I provided 17 minutes of non-face-to-face time during this encounter including median intraservice time, reviewing previous notes, labs, imaging, medications and explaining diagnosis and management.  [name], FNP-BC

## 2019-12-11 ENCOUNTER — Other Ambulatory Visit: Payer: Self-pay

## 2019-12-11 ENCOUNTER — Other Ambulatory Visit: Payer: 59

## 2019-12-11 ENCOUNTER — Ambulatory Visit (HOSPITAL_COMMUNITY)
Admission: RE | Admit: 2019-12-11 | Discharge: 2019-12-11 | Disposition: A | Discharge status: Home / Self Care | Payer: 59 | Source: Ambulatory Visit | Attending: Nurse Practitioner | Admitting: Nurse Practitioner

## 2019-12-11 DIAGNOSIS — J329 Chronic sinusitis, unspecified: Secondary | ICD-10-CM | POA: Diagnosis not present

## 2019-12-11 NOTE — Progress Notes (Signed)
Patient here for fasting labs. 

## 2019-12-12 LAB — CMP14+EGFR
ALT: 17 IU/L (ref 0–32)
AST: 22 IU/L (ref 0–40)
Albumin/Globulin Ratio: 1.3 (ref 1.2–2.2)
Albumin: 3.9 g/dL (ref 3.8–4.8)
Alkaline Phosphatase: 76 IU/L (ref 39–117)
BUN/Creatinine Ratio: 11 (ref 9–23)
BUN: 10 mg/dL (ref 6–24)
Bilirubin Total: 0.5 mg/dL (ref 0.0–1.2)
CO2: 23 mmol/L (ref 20–29)
Calcium: 9.6 mg/dL (ref 8.7–10.2)
Chloride: 104 mmol/L (ref 96–106)
Creatinine, Ser: 0.95 mg/dL (ref 0.57–1.00)
GFR calc Af Amer: 82 mL/min/{1.73_m2} (ref >59)
GFR calc non Af Amer: 72 mL/min/{1.73_m2} (ref >59)
Globulin, Total: 3.1 g/dL (ref 1.5–4.5)
Glucose: 80 mg/dL (ref 65–99)
Potassium: 4.4 mmol/L (ref 3.5–5.2)
Sodium: 138 mmol/L (ref 134–144)
Total Protein: 7 g/dL (ref 6.0–8.5)

## 2019-12-12 LAB — LIPID PANEL
Chol/HDL Ratio: 2.8 ratio (ref 0.0–4.4)
Cholesterol, Total: 171 mg/dL (ref 100–199)
HDL: 61 mg/dL (ref >39)
LDL Chol Calc (NIH): 96 mg/dL (ref 0–99)
Triglycerides: 77 mg/dL (ref 0–149)
VLDL Cholesterol Cal: 14 mg/dL (ref 5–40)

## 2019-12-12 LAB — CBC
Hematocrit: 44.1 % (ref 34.0–46.6)
Hemoglobin: 13.6 g/dL (ref 11.1–15.9)
MCH: 23 pg — ABNORMAL LOW (ref 26.6–33.0)
MCHC: 30.8 g/dL — ABNORMAL LOW (ref 31.5–35.7)
MCV: 75 fL — ABNORMAL LOW (ref 79–97)
Platelets: 178 10*3/uL (ref 150–450)
RBC: 5.92 x10E6/uL — ABNORMAL HIGH (ref 3.77–5.28)
RDW: 16 % — ABNORMAL HIGH (ref 11.7–15.4)
WBC: 7.1 10*3/uL (ref 3.4–10.8)

## 2019-12-12 LAB — HEMOGLOBIN A1C
Est. average glucose Bld gHb Est-mCnc: 105 mg/dL
Hgb A1c MFr Bld: 5.3 % (ref 4.8–5.6)

## 2020-02-17 ENCOUNTER — Telehealth: Payer: Self-pay | Admitting: Nurse Practitioner

## 2020-02-17 NOTE — Telephone Encounter (Signed)
Dropped off FMLA paperwork and changed for Dr Meredeth Ide to be her pcp

## 2020-02-17 NOTE — Telephone Encounter (Signed)
Spoke to patient. Pt. Stated she dropped off the FMLA form asking for time off for work for anxiety and depression.  CMA informed patient she would need to contact her psychotherapist to see if they can fill out the [facility] form.   Pt. Understood.

## 2020-02-18 ENCOUNTER — Other Ambulatory Visit: Payer: 59

## 2020-02-18 ENCOUNTER — Other Ambulatory Visit: Payer: Self-pay | Admitting: Nurse Practitioner

## 2020-02-18 ENCOUNTER — Ambulatory Visit: Payer: 59 | Attending: Nurse Practitioner | Admitting: Nurse Practitioner

## 2020-02-18 ENCOUNTER — Encounter: Payer: Self-pay | Admitting: Nurse Practitioner

## 2020-02-18 ENCOUNTER — Other Ambulatory Visit: Payer: Self-pay

## 2020-02-18 DIAGNOSIS — E559 Vitamin D deficiency, unspecified: Secondary | ICD-10-CM

## 2020-02-18 DIAGNOSIS — R4586 Emotional lability: Secondary | ICD-10-CM

## 2020-02-18 NOTE — Progress Notes (Unsigned)
Rose Douglas initially established care at [facility] on 11-30-2019. .I was the covering provider that day. Unfortunately [name] assumed she was establishing care with me and unbeknownst to her that Dr. Earlene Plater was to be her PCP as she is the Primary at [facility]  During her televisit call she discussed her recurrent sinus infections and was referred to ENT and blood work was ordered.   I had not seen or evaluated her since the televisit in January. Earlier this week [name] came to the [employer]  and left FMLA papers. When my nurse called her regarding the diagnosis she was instructed that Ms. Valone needed paperwork filled out for her anxiety and mood disorder and that she had been dealing with this since October. She did not mention any of this to me during our phone call in January. She has not been on zoloft for several years.   During our phone call today she was very frustrated with the following concerns:  Blood work results: She states she had not been called regarding her blood work results from  January. I explained to her that if a patient has mychart messages we would send the lab result with a detailed message through mychart. She did read the mychart messages but states she did not understand them. I discussed her results with her today. She states her previous provider always called her regarding her results (Gynecology). I did instruct her in the future if she does not understand her results she can always call for further explanation or send a mychart message as well. I instructed her that I was not aware she had a vitamin D deficiency as we had not discussed that during her office visit.  I did offer to have her return for Vit D level and I would call her with the results.    ENT referral: She states she never heard from them regarding her appointment. She was given the number to the ENT office for scheduling.   FMLA papers: she states  her insurance company told her that I could fill out her FMLA papers. I recommended since  she was seeing a psychotherapist for this then I would recommended she follow back up with them as they know her history and could verify if she needs FMLA.   Back Pain: She would like to know if she should see a Primary or chiropractor for her back pain which she believes is due to her large breasts. Interested in breast reduction surgery. She will follow up at [facility] in a few weeks with Dr. Earlene Plater

## 2020-02-18 NOTE — Progress Notes (Signed)
Patient here for labs ordered during recent phone visit. 

## 2020-02-19 LAB — VITAMIN D 25 HYDROXY (VIT D DEFICIENCY, FRACTURES): Vit D, 25-Hydroxy: 27 ng/mL — ABNORMAL LOW (ref 30.0–100.0)

## 2020-02-19 LAB — TSH: TSH: 0.598 u[IU]/mL (ref 0.450–4.500)

## 2020-03-01 NOTE — Progress Notes (Signed)
Normal lab letter mailed.

## 2020-03-03 ENCOUNTER — Telehealth: Payer: Self-pay

## 2020-03-03 NOTE — Patient Instructions (Signed)
Thank you for choosing Primary Care at [facility] to be your medical home!    Rose Douglas was seen by De Hollingshead, DO today.   Rose Douglas's primary care provider is Marcy Siren, DO.   For the best care possible, you should try to see Marcy Siren, DO whenever you come to the clinic.   We look forward to seeing you again soon!  If you have any questions about your visit today, please call us at [phone] or feel free to reach your primary care provider via MyChart.

## 2020-03-03 NOTE — Telephone Encounter (Signed)
Called patient to do their pre-visit COVID screening.  Call went to voicemail. Unable to do prescreening.  

## 2020-03-04 ENCOUNTER — Encounter: Payer: Self-pay | Admitting: Internal Medicine

## 2020-03-04 ENCOUNTER — Ambulatory Visit (INDEPENDENT_AMBULATORY_CARE_PROVIDER_SITE_OTHER): Payer: 59 | Admitting: Internal Medicine

## 2020-03-04 VITALS — BP 118/87 | HR 67 | Temp 97.2°F | Resp 17 | Ht 65.0 in | Wt 188.0 lb

## 2020-03-04 DIAGNOSIS — G8929 Other chronic pain: Secondary | ICD-10-CM

## 2020-03-04 DIAGNOSIS — N62 Hypertrophy of breast: Secondary | ICD-10-CM | POA: Diagnosis not present

## 2020-03-04 DIAGNOSIS — F329 Major depressive disorder, single episode, unspecified: Secondary | ICD-10-CM

## 2020-03-04 DIAGNOSIS — M546 Pain in thoracic spine: Secondary | ICD-10-CM

## 2020-03-04 DIAGNOSIS — F32A Depression, unspecified: Secondary | ICD-10-CM

## 2020-03-04 MED ORDER — CYCLOBENZAPRINE HCL 10 MG PO TABS: 10.0000 mg | ORAL_TABLET | Freq: Three times a day (TID) | ORAL | 0 refills | Status: DC | PRN

## 2020-03-04 MED ORDER — BUPROPION HCL ER (XL) 150 MG PO TB24: 150.0000 mg | ORAL_TABLET | Freq: Every day | ORAL | 0 refills | Status: DC

## 2020-03-04 MED ORDER — MELOXICAM 15 MG PO TABS: 15.0000 mg | ORAL_TABLET | Freq: Every day | ORAL | 0 refills | Status: DC

## 2020-03-04 NOTE — Progress Notes (Signed)
Subjective:    Rose Douglas - 49 y.o. female MRN [id number]  Date of birth: [birth year]  HPI  Rose Douglas is here for concerns about back pain.  Back pain has been occurring for about the past 6 months, fairly constant. Located in the thoracic spine and across her shoulders. She believes it is related to her breast size--wears 40 DD. She has had increase in her breast size throughout adult life from 38C. Has a lot of back pain when wearing bras. Has tried lots of different bras without improvement. Using muscle rub on back, getting massage, using heating pad. No medication use.    Patient also requests medication for depression. She is seeing a therapist who recommend anti-depressant. Has had a lot of trouble with feeling down during the Pandemic. Has not wanted to do her work or engage socially with her friends. She reports a nervous break down a few years ago and would like to avoid that happening again. She has tried several medications in the past, including Zoloft and Celexa. She would like to avoid these and similar medications due to weight gain. She also felt like the Zoloft caused her to be "out of it" and to have a change in her personality. Family member takes Wellbutrin with good outcomes. No personal history of seizure disorder. No active plan to harm herself.   Depression screen [facility] 2/9 03/04/2020 12/10/2018  Decreased Interest 3 1  Down, Depressed, Hopeless 2 0  PHQ - 2 Score 5 1  Altered sleeping 3 3  Tired, decreased energy 3 3  Change in appetite 3 2  Feeling bad or failure about yourself  3 1  Trouble concentrating 3 2  Moving slowly or fidgety/restless 2 2  Suicidal thoughts 1 0  PHQ-9 Score 23 14   GAD 7 : Generalized Anxiety Score 03/04/2020 12/10/2018  Nervous, Anxious, on Edge 3 2  Control/stop worrying 3 1  Worry too much - different things 3 1  Trouble relaxing 3 0  Restless 3 0  Easily annoyed or irritable 2 1  Afraid - awful might happen 2 0  Total GAD 7  Score 19 5     Health Maintenance:  Health Maintenance Due  Topic Date Due  . TETANUS/TDAP  Never done    -  reports that she has quit smoking. Her smoking use included cigarettes. She has never used smokeless tobacco. - Review of Systems: Per HPI. - Past Medical History: There are no problems to display for this patient.  - Medications: reviewed and updated   Objective:   Physical Exam BP 118/87   Pulse 67   Temp (!) 97.2 F (36.2 C) (Temporal)   Resp 17   Ht 5\' 5"  (1.651 m)   Wt 188 lb (85.3 kg)   SpO2 97%   BMI 31.28 kg/m  Physical Exam  Constitutional: She is oriented to person, place, and time and well-developed, well-nourished, and in no distress. No distress.  Cardiovascular: Normal rate.  Pulmonary/Chest: Effort normal. No respiratory distress.  Musculoskeletal:        General: Normal range of motion.     Comments: TTP over the deltoids, trapezius, rhomboids bilaterally. Full ROM at upper back. No evidence of significant scoliosis.   Neurological: She is alert and oriented to person, place, and time.  Skin: Skin is warm and dry. She is not diaphoretic.  Psychiatric: Affect and judgment normal.           Assessment & Plan:   1.  Large breasts Requests referral for breast reduction due to chronic back pain.  - Ambulatory referral to Plastic Surgery  2. Depression, unspecified depression type PHQ-9 significantly elevated. Will start Wellbutrin. Avoid SSRIs due to patient concern for weight gain and personality changes and other AEs in the past with this class of medication. Will reevaluate mood in 4 weeks and consider dose increase of Wellbutrin. If GAD-7 remains elevated, would consider addition of medication such as Buspar for the anxiety component. Continue with psychotherapy.  - buPROPion (WELLBUTRIN XL) 150 MG 24 hr tablet; Take 1 tablet (150 mg total) by mouth daily.  Dispense: 90 tablet; Refill: 0  3. Chronic bilateral thoracic back pain Referral for  evaluation for breast reduction as above. Pain seems to be related to muscle tension likely from body mechanics. Discussed option imaging of thoracic spine to ensure no OA or disc herniation, although suspected to be less likely etiology, and patient declined at present. Prescribed NSAID and muscle relaxer in attempt to help with pain control.  - meloxicam (MOBIC) 15 MG tablet; Take 1 tablet (15 mg total) by mouth daily.  Dispense: 30 tablet; Refill: 0 - cyclobenzaprine (FLEXERIL) 10 MG tablet; Take 1 tablet (10 mg total) by mouth 3 (three) times daily as needed for muscle spasms.  Dispense: 30 tablet; Refill: 0   Phill Myron, D.O. 03/04/2020, 1:45 PM Primary Care at [facility]

## 2020-03-29 ENCOUNTER — Other Ambulatory Visit: Payer: Self-pay

## 2020-03-29 DIAGNOSIS — G8929 Other chronic pain: Secondary | ICD-10-CM

## 2020-03-29 MED ORDER — MELOXICAM 15 MG PO TABS: 15.0000 mg | ORAL_TABLET | Freq: Every day | ORAL | 0 refills | Status: DC

## 2020-04-08 ENCOUNTER — Ambulatory Visit (INDEPENDENT_AMBULATORY_CARE_PROVIDER_SITE_OTHER): Payer: 59 | Admitting: Plastic Surgery

## 2020-04-08 ENCOUNTER — Encounter: Payer: Self-pay | Admitting: Plastic Surgery

## 2020-04-08 ENCOUNTER — Other Ambulatory Visit: Payer: Self-pay

## 2020-04-08 VITALS — BP 110/78 | HR 83 | Temp 97.3°F | Ht 66.0 in | Wt 192.0 lb

## 2020-04-08 DIAGNOSIS — M4004 Postural kyphosis, thoracic region: Secondary | ICD-10-CM | POA: Diagnosis not present

## 2020-04-08 DIAGNOSIS — N62 Hypertrophy of breast: Secondary | ICD-10-CM

## 2020-04-08 DIAGNOSIS — M545 Low back pain, unspecified: Secondary | ICD-10-CM

## 2020-04-08 DIAGNOSIS — M546 Pain in thoracic spine: Secondary | ICD-10-CM

## 2020-04-08 NOTE — Progress Notes (Signed)
Referring Provider Arvilla Market, DO [address] [place],  [place] [address]   CC:  Chief Complaint  Patient presents with  . Consult    Breast reduction      Rose Douglas is an 49 y.o. female.  HPI: Patient is here to discuss breast reduction.  She has had years of back pain, neck pain and shoulder grooving from her large breasts.  She is currently a triple D and wants to be around a B cup.  She has no family history of breast cancer and her last mammogram was without suspicious findings.  She is never required a biopsy or breast procedure.  She is an occasional cigar smoker and has had a hysterectomy.  For her back pain she has tried over-the-counter medications, warm packs, cold packs.  Allergies  Allergen Reactions  . Latex Rash    Outpatient Encounter Medications as of 04/08/2020  Medication Sig  . buPROPion (WELLBUTRIN XL) 150 MG 24 hr tablet Take 1 tablet (150 mg total) by mouth daily.  . cyclobenzaprine (FLEXERIL) 10 MG tablet Take 1 tablet (10 mg total) by mouth 3 (three) times daily as needed for muscle spasms.  . meloxicam (MOBIC) 15 MG tablet Take 1 tablet (15 mg total) by mouth daily.   No facility-administered encounter medications on file as of 04/08/2020.     Past Medical History:  Diagnosis Date  . Anxiety   . Asthma   . Class 1 obesity due to excess calories in adult   . GERD (gastroesophageal reflux disease)   . History of stomach ulcers   . History of ventricular fibrillation    had total hysterectomy  . Moderate depressive disorder   . Seasonal allergies   . Vitamin D deficiency     Past Surgical History:  Procedure Laterality Date  . TOTAL ABDOMINAL HYSTERECTOMY    . TUBAL LIGATION      Family History  Problem Relation Age of Onset  . Lupus Mother   . Early death Mother   . Diabetes Maternal Grandmother   . Cancer Paternal Grandmother   . Cancer Paternal Grandfather   . Breast cancer Maternal Aunt   . Alcohol abuse  Maternal Grandfather     Social History   Social History Narrative  . Not on file     Review of Systems General: Denies fevers, chills, weight loss CV: Denies chest pain, shortness of breath, palpitations  Physical Exam Vitals with BMI 04/08/2020 03/04/2020 10/27/2019  Height 5\' 6"  5\' 5"  -  Weight 192 lbs 188 lbs -  BMI 31 31.28 -  Systolic 110 118  Diastolic 78 87 86  Pulse 83 67 83    General:  No acute distress,  Alert and oriented, Non-Toxic, Normal speech and affect Breast: She has grade 3 ptosis.  Sternal notch to nipple is 33 cm bilaterally.  Nipple to fold is 18 cm on the right and 17 cm on the left.  I do not see any obvious scars or masses.  Assessment/Plan The patient has bilateral symptomatic macromastia.  She is a good candidate for a breast reduction.  She is interested in pursuing surgical treatment.  The details of breast reduction surgery were discussed.  I explained the procedure in detail along the with the expected scars.  The risks were discussed in detail and include bleeding, infection, damage to surrounding structures, need for additional procedures, nipple loss, change in nipple sensation, persistent pain, contour irregularities and asymmetries.  I explained  that breast feeding is often not possible after breast reduction surgery.  We discussed the expected postoperative course with an overall recovery period of about 1 month.  She demonstrated full understanding of all risks.  We discussed her personal risk factors.  I anticipate approximately 800 g of tissue removed from each side.   Cindra Presume 04/08/2020, 3:11 PM

## 2020-04-26 DIAGNOSIS — N62 Hypertrophy of breast: Secondary | ICD-10-CM | POA: Insufficient documentation

## 2020-04-26 NOTE — H&P (View-Only) (Signed)
ICD-10-CM   1. Macromastia  N62       Patient ID: Rose Douglas, female    DOB: Sep 28, 1971, 49 y.o.   MRN: [id number]   History of Present Illness: Rose Douglas is a 49 y.o.  female  with a history of macromastia.  She presents for preoperative evaluation for upcoming procedure, bilateral breast reduction, scheduled for 05/18/2020 with Dr. Arita Miss.  Summary from previous visit: Patient is currently a DDD and wants to be around a C cup.  No family history of breast cancer.  She has grade 3 ptosis.  Sternal notch to nipple distance is 33 cm bilaterally.  Nipple to IMF is 18 cm on the right and 17 cm on the left.  No obvious scars or masses.  Estimated excess breast tissue to be removed at time of surgery is 800 g from each side.  YJE:HUDJSHFW work, currently [facility]  PMH Significant for: Latex allergy, anxiety, GERD, reports no issues/inhaler use for asthma in over 20 years - she feels it was anxiety related panic attacks not asthma.   The patient has not had problems with anesthesia.   Past Medical History: Allergies: Allergies  Allergen Reactions  . Latex Rash    Current Medications:  Current Outpatient Medications:  .  buPROPion (WELLBUTRIN XL) 150 MG 24 hr tablet, Take 1 tablet (150 mg total) by mouth daily., Disp: 90 tablet, Rfl: 0 .  cyclobenzaprine (FLEXERIL) 10 MG tablet, Take 1 tablet (10 mg total) by mouth 3 (three) times daily as needed for muscle spasms., Disp: 30 tablet, Rfl: 0 .  meloxicam (MOBIC) 15 MG tablet, Take 1 tablet (15 mg total) by mouth daily., Disp: 30 tablet, Rfl: 0  Past Medical Problems: Past Medical History:  Diagnosis Date  . Anxiety   . Asthma   . Class 1 obesity due to excess calories in adult   . GERD (gastroesophageal reflux disease)   . History of stomach ulcers   . History of ventricular fibrillation    had total hysterectomy  . Moderate depressive disorder   . Seasonal allergies   . Vitamin D deficiency     Past Surgical History: Past  Surgical History:  Procedure Laterality Date  . TOTAL ABDOMINAL HYSTERECTOMY    . TUBAL LIGATION      Social History: Social History   Socioeconomic History  . Marital status: Unknown    Spouse name: Not on file  . Number of children: Not on file  . Years of education: Not on file  . Highest education level: Not on file  Occupational History  . Not on file  Tobacco Use  . Smoking status: Former Smoker    Types: Cigarettes  . Smokeless tobacco: Never Used  Substance and Sexual Activity  . Alcohol use: Yes  . Drug use: Never  . Sexual activity: Not on file  Other Topics Concern  . Not on file  Social History Narrative  . Not on file   Social Determinants of Health   Financial Resource Strain:   . Difficulty of Paying Living Expenses:   Food Insecurity:   . Worried About Programme researcher, broadcasting/film/video in the Last Year:   . Barista in the Last Year:   Transportation Needs:   . Freight forwarder (Medical):   Marland Kitchen Lack of Transportation (Non-Medical):   Physical Activity:   . Days of Exercise per Week:   . Minutes of Exercise per Session:   Stress:   .  Feeling of Stress :   Social Connections:   . Frequency of Communication with Friends and Family:   . Frequency of Social Gatherings with Friends and Family:   . Attends Religious Services:   . Active Member of Clubs or Organizations:   . Attends Archivist Meetings:   Marland Kitchen Marital Status:   Intimate Partner Violence:   . Fear of Current or Ex-Partner:   . Emotionally Abused:   Marland Kitchen Physically Abused:   . Sexually Abused:     Family History: Family History  Problem Relation Age of Onset  . Lupus Mother   . Early death Mother   . Diabetes Maternal Grandmother   . Cancer Paternal Grandmother   . Cancer Paternal Grandfather   . Breast cancer Maternal Aunt   . Alcohol abuse Maternal Grandfather     Review of Systems: Review of Systems  Constitutional: Negative for chills and fever.  HENT: Negative for  congestion and sore throat.   Respiratory: Negative for cough and shortness of breath.   Cardiovascular: Negative for chest pain and palpitations.  Gastrointestinal: Negative for abdominal pain, nausea and vomiting.  Musculoskeletal: Positive for back pain and neck pain. Negative for joint pain and myalgias.  Skin: Negative for itching and rash.    Physical Exam: Vital Signs BP 116/83 (BP Location: Left Arm, Patient Position: Sitting, Cuff Size: Large)   Pulse 71   Temp 97.7 F (36.5 C) (Temporal)   Ht 5' 6.5" (1.689 m)   Wt 193 lb (87.5 kg)   SpO2 98%   BMI 30.68 kg/m  Physical Exam Constitutional:      Appearance: Normal appearance. She is normal weight.  HENT:     Head: Normocephalic and atraumatic.  Eyes:     Extraocular Movements: Extraocular movements intact.  Cardiovascular:     Rate and Rhythm: Normal rate and regular rhythm.     Pulses: Normal pulses.     Heart sounds: Normal heart sounds.  Pulmonary:     Effort: Pulmonary effort is normal.     Breath sounds: Normal breath sounds. No wheezing, rhonchi or rales.  Abdominal:     General: Bowel sounds are normal.     Palpations: Abdomen is soft.  Musculoskeletal:        General: No swelling. Normal range of motion.     Cervical back: Normal range of motion.  Skin:    General: Skin is warm and dry.     Coloration: Skin is not pale.     Findings: No erythema or rash.  Neurological:     General: No focal deficit present.     Mental Status: She is alert and oriented to person, place, and time.  Psychiatric:        Mood and Affect: Mood normal.        Behavior: Behavior normal.        Thought Content: Thought content normal.        Judgment: Judgment normal.     Assessment/Plan:  Rose Douglas scheduled for bilateral breast reduction with Dr. Claudia Desanctis.  Risks, benefits, and alternatives of procedure discussed, questions answered and consent obtained.    Smoking Status: Occasional cigar smoker; Counseling Given?   Yes Last Mammogram: Believes she had one last year and it was normal. Last mammo in chart is 08/10/16 with no evidence of malignancy.  Recommend she try to get an updated one prior to surgery. Patient in agreement to try to get this done.  Caprini Score: 7  High; Risk Factors include: 49 year old female, BMI > 25, grandmother had hx of blood clots and length of planned surgery. Recommendation for mechanical and pharmacological prophylaxis during surgery. Encourage early ambulation.   Pictures obtained: 04/08/20  Post-op Rx sent to pharmacy: norco, zofran Patient instructed that she should use Ibuprofen (or mobic) and Tylenol as first time pain management. Instructed to use Ibuprofen or Mobic, but not to use both.  Patient was provided with the Breast Reduction Risks and General Surgical Risks consent document and Pain Medication Agreement prior to their appointment.  They had adequate time to read through the risk consent documents and Pain Medication Agreement. We also discussed them in person together during this preop appointment. All of their questions were answered to their satisfaction.  Recommended calling if they have any further questions.  Risk consent form and Pain Medication Agreement to be scanned into patient's chart.  The risk that can be encountered with breast reduction were discussed and include the following but not limited to these:  Breast asymmetry, fluid accumulation, firmness of the breast, inability to breast feed, loss of nipple or areola, skin loss, decrease or no nipple sensation, fat necrosis of the breast tissue, bleeding, infection, healing delay.  There are risks of anesthesia, changes to skin sensation and injury to nerves or blood vessels.  The muscle can be temporarily or permanently injured.  You may have an allergic reaction to tape, suture, glue, blood products which can result in skin discoloration, swelling, pain, skin lesions, poor healing.  Any of these can lead to  the need for revisonal surgery or stage procedures.  A reduction has potential to interfere with diagnostic procedures.  Nipple or breast piercing can increase risks of infection.  This procedure is best done when the breast is fully developed.  Changes in the breast will continue to occur over time.  Pregnancy can alter the outcomes of previous breast reduction surgery, weight gain and weigh loss can also effect the long term appearance.   The 21st Century Cures Act was signed into law in 2016 which includes the topic of electronic health records.  This provides immediate access to information in MyChart.  This includes consultation notes, operative notes, office notes, lab results and pathology reports.  If you have any questions about what you read please let us know at your next visit or call us at the office.  We are right here with you.   Electronically signed by: Eldridge Abrahams, PA-C 04/28/2020 2:17 PM

## 2020-04-26 NOTE — Progress Notes (Signed)
ICD-10-CM   1. Macromastia  N62       Patient ID: Rose Douglas, female    DOB: Sep 28, 1971, 49 y.o.   MRN: [id number]   History of Present Illness: Rose Douglas is a 49 y.o.  female  with a history of macromastia.  She presents for preoperative evaluation for upcoming procedure, bilateral breast reduction, scheduled for 05/18/2020 with Dr. Arita Miss.  Summary from previous visit: Patient is currently a DDD and wants to be around a C cup.  No family history of breast cancer.  She has grade 3 ptosis.  Sternal notch to nipple distance is 33 cm bilaterally.  Nipple to IMF is 18 cm on the right and 17 cm on the left.  No obvious scars or masses.  Estimated excess breast tissue to be removed at time of surgery is 800 g from each side.  YJE:HUDJSHFW work, currently [facility]  PMH Significant for: Latex allergy, anxiety, GERD, reports no issues/inhaler use for asthma in over 20 years - she feels it was anxiety related panic attacks not asthma.   The patient has not had problems with anesthesia.   Past Medical History: Allergies: Allergies  Allergen Reactions  . Latex Rash    Current Medications:  Current Outpatient Medications:  .  buPROPion (WELLBUTRIN XL) 150 MG 24 hr tablet, Take 1 tablet (150 mg total) by mouth daily., Disp: 90 tablet, Rfl: 0 .  cyclobenzaprine (FLEXERIL) 10 MG tablet, Take 1 tablet (10 mg total) by mouth 3 (three) times daily as needed for muscle spasms., Disp: 30 tablet, Rfl: 0 .  meloxicam (MOBIC) 15 MG tablet, Take 1 tablet (15 mg total) by mouth daily., Disp: 30 tablet, Rfl: 0  Past Medical Problems: Past Medical History:  Diagnosis Date  . Anxiety   . Asthma   . Class 1 obesity due to excess calories in adult   . GERD (gastroesophageal reflux disease)   . History of stomach ulcers   . History of ventricular fibrillation    had total hysterectomy  . Moderate depressive disorder   . Seasonal allergies   . Vitamin D deficiency     Past Surgical History: Past  Surgical History:  Procedure Laterality Date  . TOTAL ABDOMINAL HYSTERECTOMY    . TUBAL LIGATION      Social History: Social History   Socioeconomic History  . Marital status: Unknown    Spouse name: Not on file  . Number of children: Not on file  . Years of education: Not on file  . Highest education level: Not on file  Occupational History  . Not on file  Tobacco Use  . Smoking status: Former Smoker    Types: Cigarettes  . Smokeless tobacco: Never Used  Substance and Sexual Activity  . Alcohol use: Yes  . Drug use: Never  . Sexual activity: Not on file  Other Topics Concern  . Not on file  Social History Narrative  . Not on file   Social Determinants of Health   Financial Resource Strain:   . Difficulty of Paying Living Expenses:   Food Insecurity:   . Worried About Programme researcher, broadcasting/film/video in the Last Year:   . Barista in the Last Year:   Transportation Needs:   . Freight forwarder (Medical):   Marland Kitchen Lack of Transportation (Non-Medical):   Physical Activity:   . Days of Exercise per Week:   . Minutes of Exercise per Session:   Stress:   .  Feeling of Stress :   Social Connections:   . Frequency of Communication with Friends and Family:   . Frequency of Social Gatherings with Friends and Family:   . Attends Religious Services:   . Active Member of Clubs or Organizations:   . Attends Archivist Meetings:   Marland Kitchen Marital Status:   Intimate Partner Violence:   . Fear of Current or Ex-Partner:   . Emotionally Abused:   Marland Kitchen Physically Abused:   . Sexually Abused:     Family History: Family History  Problem Relation Age of Onset  . Lupus Mother   . Early death Mother   . Diabetes Maternal Grandmother   . Cancer Paternal Grandmother   . Cancer Paternal Grandfather   . Breast cancer Maternal Aunt   . Alcohol abuse Maternal Grandfather     Review of Systems: Review of Systems  Constitutional: Negative for chills and fever.  HENT: Negative for  congestion and sore throat.   Respiratory: Negative for cough and shortness of breath.   Cardiovascular: Negative for chest pain and palpitations.  Gastrointestinal: Negative for abdominal pain, nausea and vomiting.  Musculoskeletal: Positive for back pain and neck pain. Negative for joint pain and myalgias.  Skin: Negative for itching and rash.    Physical Exam: Vital Signs BP 116/83 (BP Location: Left Arm, Patient Position: Sitting, Cuff Size: Large)   Pulse 71   Temp 97.7 F (36.5 C) (Temporal)   Ht 5' 6.5" (1.689 m)   Wt 193 lb (87.5 kg)   SpO2 98%   BMI 30.68 kg/m  Physical Exam Constitutional:      Appearance: Normal appearance. She is normal weight.  HENT:     Head: Normocephalic and atraumatic.  Eyes:     Extraocular Movements: Extraocular movements intact.  Cardiovascular:     Rate and Rhythm: Normal rate and regular rhythm.     Pulses: Normal pulses.     Heart sounds: Normal heart sounds.  Pulmonary:     Effort: Pulmonary effort is normal.     Breath sounds: Normal breath sounds. No wheezing, rhonchi or rales.  Abdominal:     General: Bowel sounds are normal.     Palpations: Abdomen is soft.  Musculoskeletal:        General: No swelling. Normal range of motion.     Cervical back: Normal range of motion.  Skin:    General: Skin is warm and dry.     Coloration: Skin is not pale.     Findings: No erythema or rash.  Neurological:     General: No focal deficit present.     Mental Status: She is alert and oriented to person, place, and time.  Psychiatric:        Mood and Affect: Mood normal.        Behavior: Behavior normal.        Thought Content: Thought content normal.        Judgment: Judgment normal.     Assessment/Plan:  Rose Douglas scheduled for bilateral breast reduction with Dr. Claudia Desanctis.  Risks, benefits, and alternatives of procedure discussed, questions answered and consent obtained.    Smoking Status: Occasional cigar smoker; Counseling Given?   Yes Last Mammogram: Believes she had one last year and it was normal. Last mammo in chart is 08/10/16 with no evidence of malignancy.  Recommend she try to get an updated one prior to surgery. Patient in agreement to try to get this done.  Caprini Score: 7  High; Risk Factors include: 48-year-old female, BMI > 25, grandmother had hx of blood clots and length of planned surgery. Recommendation for mechanical and pharmacological prophylaxis during surgery. Encourage early ambulation.   Pictures obtained: 04/08/20  Post-op Rx sent to pharmacy: norco, zofran Patient instructed that she should use Ibuprofen (or mobic) and Tylenol as first time pain management. Instructed to use Ibuprofen or Mobic, but not to use both.  Patient was provided with the Breast Reduction Risks and General Surgical Risks consent document and Pain Medication Agreement prior to their appointment.  They had adequate time to read through the risk consent documents and Pain Medication Agreement. We also discussed them in person together during this preop appointment. All of their questions were answered to their satisfaction.  Recommended calling if they have any further questions.  Risk consent form and Pain Medication Agreement to be scanned into patient's chart.  The risk that can be encountered with breast reduction were discussed and include the following but not limited to these:  Breast asymmetry, fluid accumulation, firmness of the breast, inability to breast feed, loss of nipple or areola, skin loss, decrease or no nipple sensation, fat necrosis of the breast tissue, bleeding, infection, healing delay.  There are risks of anesthesia, changes to skin sensation and injury to nerves or blood vessels.  The muscle can be temporarily or permanently injured.  You may have an allergic reaction to tape, suture, glue, blood products which can result in skin discoloration, swelling, pain, skin lesions, poor healing.  Any of these can lead to  the need for revisonal surgery or stage procedures.  A reduction has potential to interfere with diagnostic procedures.  Nipple or breast piercing can increase risks of infection.  This procedure is best done when the breast is fully developed.  Changes in the breast will continue to occur over time.  Pregnancy can alter the outcomes of previous breast reduction surgery, weight gain and weigh loss can also effect the long term appearance.   The 21st Century Cures Act was signed into law in 2016 which includes the topic of electronic health records.  This provides immediate access to information in MyChart.  This includes consultation notes, operative notes, office notes, lab results and pathology reports.  If you have any questions about what you read please let us know at your next visit or call us at the office.  We are right here with you.   Electronically signed by: Noor Vidales C Traci Gafford, PA-C 04/28/2020 2:17 PM          

## 2020-04-26 NOTE — Patient Instructions (Signed)
Activity As tolerated: NO showers for 3 days No heavy activities  Diet: Regular  Wound Care: Keep dressing clean & dry for 3 days.  Keep wrap applied with compression as much as possible 24/7.    Do not change dressings for 3 days unless soiled.  Can change if needed but make sure to reapply wrap. After three days can remove wrap and shower.  Then reapply dressings if needed and continue compression with wrap or soft sports bra.  Call doctor if any unusual problems occur such as pain, excessive bleeding, unrelieved nausea/vomiting, fever &/or chills  Pain Management Plan: 1) Ibuprofen 800 mg every 8 hours (or Moloxicam as perscribed)          If still having pain... Add 2) Tylenol 500 mg every 8 hours           If still in pain... Add 3) Rx pain medication         Recommend @ night         May use up to every 8 hours if needed for severe pain  You may alternate between Ibuprofen and Tylenol taking one every 4 hours if desired. May use ice, avoid nipples and incisions

## 2020-04-28 ENCOUNTER — Ambulatory Visit (INDEPENDENT_AMBULATORY_CARE_PROVIDER_SITE_OTHER): Payer: 59 | Admitting: Plastic Surgery

## 2020-04-28 ENCOUNTER — Other Ambulatory Visit: Payer: Self-pay

## 2020-04-28 ENCOUNTER — Encounter: Payer: Self-pay | Admitting: Plastic Surgery

## 2020-04-28 DIAGNOSIS — N62 Hypertrophy of breast: Secondary | ICD-10-CM

## 2020-04-28 MED ORDER — ONDANSETRON HCL 4 MG PO TABS: 4.0000 mg | ORAL_TABLET | Freq: Three times a day (TID) | ORAL | 0 refills | Status: DC | PRN

## 2020-04-28 MED ORDER — HYDROCODONE-ACETAMINOPHEN 5-325 MG PO TABS: 1.0000 | ORAL_TABLET | Freq: Three times a day (TID) | ORAL | 0 refills | Status: AC | PRN

## 2020-05-03 ENCOUNTER — Other Ambulatory Visit: Payer: Self-pay | Admitting: Plastic Surgery

## 2020-05-03 ENCOUNTER — Telehealth: Payer: 59 | Admitting: Internal Medicine

## 2020-05-03 DIAGNOSIS — Z1231 Encounter for screening mammogram for malignant neoplasm of breast: Secondary | ICD-10-CM

## 2020-05-10 ENCOUNTER — Other Ambulatory Visit: Payer: Self-pay

## 2020-05-10 ENCOUNTER — Ambulatory Visit
Admission: RE | Admit: 2020-05-10 | Discharge: 2020-05-10 | Disposition: A | Discharge status: Home / Self Care | Payer: 59 | Source: Ambulatory Visit | Attending: Plastic Surgery | Admitting: Plastic Surgery

## 2020-05-10 ENCOUNTER — Encounter (HOSPITAL_BASED_OUTPATIENT_CLINIC_OR_DEPARTMENT_OTHER): Payer: Self-pay | Admitting: Plastic Surgery

## 2020-05-10 DIAGNOSIS — Z1231 Encounter for screening mammogram for malignant neoplasm of breast: Secondary | ICD-10-CM

## 2020-05-11 ENCOUNTER — Other Ambulatory Visit: Payer: Self-pay | Admitting: Plastic Surgery

## 2020-05-11 DIAGNOSIS — R928 Other abnormal and inconclusive findings on diagnostic imaging of breast: Secondary | ICD-10-CM

## 2020-05-12 ENCOUNTER — Ambulatory Visit
Admission: RE | Admit: 2020-05-12 | Discharge: 2020-05-12 | Disposition: A | Discharge status: Home / Self Care | Payer: 59 | Source: Ambulatory Visit | Attending: Plastic Surgery | Admitting: Plastic Surgery

## 2020-05-12 ENCOUNTER — Other Ambulatory Visit: Payer: Self-pay

## 2020-05-12 ENCOUNTER — Other Ambulatory Visit: Payer: Self-pay | Admitting: Plastic Surgery

## 2020-05-12 DIAGNOSIS — R928 Other abnormal and inconclusive findings on diagnostic imaging of breast: Secondary | ICD-10-CM

## 2020-05-14 ENCOUNTER — Other Ambulatory Visit (HOSPITAL_COMMUNITY)
Admission: RE | Admit: 2020-05-14 | Discharge: 2020-05-14 | Disposition: A | Discharge status: Home / Self Care | Payer: 59 | Source: Ambulatory Visit | Attending: Plastic Surgery | Admitting: Plastic Surgery

## 2020-05-14 DIAGNOSIS — Z20822 Contact with and (suspected) exposure to covid-19: Secondary | ICD-10-CM | POA: Diagnosis not present

## 2020-05-14 DIAGNOSIS — Z01812 Encounter for preprocedural laboratory examination: Secondary | ICD-10-CM | POA: Diagnosis not present

## 2020-05-14 LAB — SARS CORONAVIRUS 2 (TAT 6-24 HRS): SARS Coronavirus 2: NEGATIVE

## 2020-05-15 ENCOUNTER — Other Ambulatory Visit (HOSPITAL_COMMUNITY): Payer: 59

## 2020-05-18 ENCOUNTER — Ambulatory Visit (HOSPITAL_BASED_OUTPATIENT_CLINIC_OR_DEPARTMENT_OTHER)
Admission: RE | Admit: 2020-05-18 | Discharge: 2020-05-18 | Disposition: A | Discharge status: Home / Self Care | Payer: 59 | Attending: Plastic Surgery | Admitting: Plastic Surgery

## 2020-05-18 ENCOUNTER — Ambulatory Visit (HOSPITAL_BASED_OUTPATIENT_CLINIC_OR_DEPARTMENT_OTHER): Payer: 59 | Admitting: Certified Registered"

## 2020-05-18 ENCOUNTER — Other Ambulatory Visit: Payer: Self-pay

## 2020-05-18 ENCOUNTER — Encounter (HOSPITAL_BASED_OUTPATIENT_CLINIC_OR_DEPARTMENT_OTHER): Payer: Self-pay | Admitting: Plastic Surgery

## 2020-05-18 ENCOUNTER — Encounter (HOSPITAL_BASED_OUTPATIENT_CLINIC_OR_DEPARTMENT_OTHER)
Admission: RE | Disposition: A | Discharge status: Home / Self Care | Payer: Self-pay | Source: Home / Self Care | Attending: Plastic Surgery

## 2020-05-18 DIAGNOSIS — Z91040 Latex allergy status: Secondary | ICD-10-CM | POA: Diagnosis not present

## 2020-05-18 DIAGNOSIS — M545 Low back pain: Secondary | ICD-10-CM

## 2020-05-18 DIAGNOSIS — E669 Obesity, unspecified: Secondary | ICD-10-CM | POA: Diagnosis not present

## 2020-05-18 DIAGNOSIS — J45909 Unspecified asthma, uncomplicated: Secondary | ICD-10-CM | POA: Insufficient documentation

## 2020-05-18 DIAGNOSIS — Z90710 Acquired absence of both cervix and uterus: Secondary | ICD-10-CM | POA: Diagnosis not present

## 2020-05-18 DIAGNOSIS — K219 Gastro-esophageal reflux disease without esophagitis: Secondary | ICD-10-CM | POA: Insufficient documentation

## 2020-05-18 DIAGNOSIS — F419 Anxiety disorder, unspecified: Secondary | ICD-10-CM | POA: Diagnosis not present

## 2020-05-18 DIAGNOSIS — Z87891 Personal history of nicotine dependence: Secondary | ICD-10-CM | POA: Insufficient documentation

## 2020-05-18 DIAGNOSIS — N62 Hypertrophy of breast: Secondary | ICD-10-CM | POA: Insufficient documentation

## 2020-05-18 DIAGNOSIS — E559 Vitamin D deficiency, unspecified: Secondary | ICD-10-CM | POA: Diagnosis not present

## 2020-05-18 DIAGNOSIS — M546 Pain in thoracic spine: Secondary | ICD-10-CM

## 2020-05-18 DIAGNOSIS — Z79899 Other long term (current) drug therapy: Secondary | ICD-10-CM | POA: Insufficient documentation

## 2020-05-18 DIAGNOSIS — M4004 Postural kyphosis, thoracic region: Secondary | ICD-10-CM

## 2020-05-18 DIAGNOSIS — Z6830 Body mass index (BMI) 30.0-30.9, adult: Secondary | ICD-10-CM | POA: Insufficient documentation

## 2020-05-18 HISTORY — PX: BREAST REDUCTION SURGERY: SHX8

## 2020-05-18 SURGERY — MAMMOPLASTY, REDUCTION
Anesthesia: General | Site: Breast | Laterality: Bilateral

## 2020-05-18 MED ORDER — PROPOFOL 500 MG/50ML IV EMUL
INTRAVENOUS | Status: AC
  Filled 2020-05-18: qty 50

## 2020-05-18 MED ORDER — CELECOXIB 200 MG PO CAPS
ORAL_CAPSULE | ORAL | Status: AC
  Filled 2020-05-18: qty 1

## 2020-05-18 MED ORDER — BUPIVACAINE HCL (PF) 0.25 % IJ SOLN
INTRAMUSCULAR | Status: AC
  Filled 2020-05-18: qty 60

## 2020-05-18 MED ORDER — EPINEPHRINE PF 1 MG/ML IJ SOLN
INTRAMUSCULAR | Status: DC | PRN
  Administered 2020-05-18: 1 mg

## 2020-05-18 MED ORDER — FENTANYL CITRATE (PF) 100 MCG/2ML IJ SOLN
INTRAMUSCULAR | Status: AC
  Filled 2020-05-18: qty 2

## 2020-05-18 MED ORDER — LACTATED RINGERS IV SOLN: INTRAVENOUS | Status: DC

## 2020-05-18 MED ORDER — ONDANSETRON HCL 4 MG/2ML IJ SOLN
INTRAMUSCULAR | Status: AC
  Filled 2020-05-18: qty 2

## 2020-05-18 MED ORDER — MIDAZOLAM HCL 5 MG/5ML IJ SOLN
INTRAMUSCULAR | Status: DC | PRN
  Administered 2020-05-18: 2 mg via INTRAVENOUS

## 2020-05-18 MED ORDER — CEFAZOLIN SODIUM-DEXTROSE 2-4 GM/100ML-% IV SOLN
2.0000 g | INTRAVENOUS | Status: AC
  Administered 2020-05-18: 2 g via INTRAVENOUS

## 2020-05-18 MED ORDER — ACETAMINOPHEN 500 MG PO TABS
ORAL_TABLET | ORAL | Status: AC
  Filled 2020-05-18: qty 2

## 2020-05-18 MED ORDER — ONDANSETRON HCL 4 MG/2ML IJ SOLN
INTRAMUSCULAR | Status: DC | PRN
  Administered 2020-05-18: 4 mg via INTRAVENOUS

## 2020-05-18 MED ORDER — PROMETHAZINE HCL 25 MG/ML IJ SOLN
6.2500 mg | INTRAMUSCULAR | Status: DC | PRN
  Administered 2020-05-18: 6.25 mg via INTRAVENOUS

## 2020-05-18 MED ORDER — SUGAMMADEX SODIUM 200 MG/2ML IV SOLN
INTRAVENOUS | Status: DC | PRN
Start: 2020-05-18 — End: 2020-05-18
  Administered 2020-05-18: 100 mg via INTRAVENOUS

## 2020-05-18 MED ORDER — OXYCODONE HCL 5 MG/5ML PO SOLN: 5.0000 mg | Freq: Once | ORAL | Status: AC | PRN

## 2020-05-18 MED ORDER — PROMETHAZINE HCL 25 MG/ML IJ SOLN
INTRAMUSCULAR | Status: AC
  Filled 2020-05-18: qty 1

## 2020-05-18 MED ORDER — OXYCODONE HCL 5 MG PO TABS
ORAL_TABLET | ORAL | Status: AC
  Filled 2020-05-18: qty 1

## 2020-05-18 MED ORDER — BUPIVACAINE HCL (PF) 0.25 % IJ SOLN
INTRAMUSCULAR | Status: DC | PRN
  Administered 2020-05-18: 30 mL

## 2020-05-18 MED ORDER — ACETAMINOPHEN 500 MG PO TABS
1000.0000 mg | ORAL_TABLET | Freq: Once | ORAL | Status: AC
  Administered 2020-05-18: 1000 mg via ORAL

## 2020-05-18 MED ORDER — DEXAMETHASONE SODIUM PHOSPHATE 4 MG/ML IJ SOLN
INTRAMUSCULAR | Status: DC | PRN
  Administered 2020-05-18: 10 mg via INTRAVENOUS

## 2020-05-18 MED ORDER — CELECOXIB 200 MG PO CAPS
200.0000 mg | ORAL_CAPSULE | Freq: Once | ORAL | Status: AC
  Administered 2020-05-18: 200 mg via ORAL

## 2020-05-18 MED ORDER — MIDAZOLAM HCL 2 MG/2ML IJ SOLN
INTRAMUSCULAR | Status: AC
  Filled 2020-05-18: qty 2

## 2020-05-18 MED ORDER — CEFAZOLIN SODIUM-DEXTROSE 2-4 GM/100ML-% IV SOLN
INTRAVENOUS | Status: AC
  Filled 2020-05-18: qty 100

## 2020-05-18 MED ORDER — DEXAMETHASONE SODIUM PHOSPHATE 10 MG/ML IJ SOLN
INTRAMUSCULAR | Status: AC
  Filled 2020-05-18: qty 1

## 2020-05-18 MED ORDER — EPINEPHRINE PF 1 MG/ML IJ SOLN
INTRAMUSCULAR | Status: AC
  Filled 2020-05-18: qty 1

## 2020-05-18 MED ORDER — FENTANYL CITRATE (PF) 100 MCG/2ML IJ SOLN
INTRAMUSCULAR | Status: DC | PRN
  Administered 2020-05-18: 50 ug via INTRAVENOUS
  Administered 2020-05-18: 100 ug via INTRAVENOUS
  Administered 2020-05-18 (×2): 50 ug via INTRAVENOUS

## 2020-05-18 MED ORDER — PROPOFOL 10 MG/ML IV BOLUS
INTRAVENOUS | Status: DC | PRN
  Administered 2020-05-18: 150 mg via INTRAVENOUS

## 2020-05-18 MED ORDER — FENTANYL CITRATE (PF) 100 MCG/2ML IJ SOLN
25.0000 ug | INTRAMUSCULAR | Status: DC | PRN
  Administered 2020-05-18 (×2): 50 ug via INTRAVENOUS

## 2020-05-18 MED ORDER — OXYCODONE HCL 5 MG PO TABS
5.0000 mg | ORAL_TABLET | Freq: Once | ORAL | Status: AC | PRN
  Administered 2020-05-18: 5 mg via ORAL

## 2020-05-18 MED ORDER — LIDOCAINE HCL (CARDIAC) PF 100 MG/5ML IV SOSY
PREFILLED_SYRINGE | INTRAVENOUS | Status: DC | PRN
  Administered 2020-05-18: 60 mg via INTRAVENOUS

## 2020-05-18 MED ORDER — LACTATED RINGERS IV SOLN
INTRAVENOUS | Status: AC | PRN
  Administered 2020-05-18: 800 mL

## 2020-05-18 MED ORDER — ROCURONIUM BROMIDE 100 MG/10ML IV SOLN
INTRAVENOUS | Status: DC | PRN
  Administered 2020-05-18: 50 mg via INTRAVENOUS

## 2020-05-18 SURGICAL SUPPLY — 72 items
BAG DECANTER FOR FLEXI CONT (MISCELLANEOUS) ×3 IMPLANT
BENZOIN TINCTURE PRP APPL 2/3 (GAUZE/BANDAGES/DRESSINGS) ×6 IMPLANT
BLADE SURG 10 STRL SS (BLADE) ×6 IMPLANT
BLADE SURG 15 STRL LF DISP TIS (BLADE) IMPLANT
BLADE SURG 15 STRL SS (BLADE)
BNDG ELASTIC 6X5.8 VLCR STR LF (GAUZE/BANDAGES/DRESSINGS) ×3 IMPLANT
BNDG GAUZE ELAST 4 BULKY (GAUZE/BANDAGES/DRESSINGS) IMPLANT
CANISTER SUCT 1200ML W/VALVE (MISCELLANEOUS) ×3 IMPLANT
CHLORAPREP W/TINT 26 (MISCELLANEOUS) ×6 IMPLANT
CLIP VESOCCLUDE MED 6/CT (CLIP) IMPLANT
COVER BACK TABLE 60X90IN (DRAPES) ×3 IMPLANT
COVER MAYO STAND STRL (DRAPES) ×3 IMPLANT
COVER WAND RF STERILE (DRAPES) IMPLANT
DECANTER SPIKE VIAL GLASS SM (MISCELLANEOUS) IMPLANT
DRAIN CHANNEL 15F RND FF W/TCR (WOUND CARE) IMPLANT
DRAPE LAPAROSCOPIC ABDOMINAL (DRAPES) ×3 IMPLANT
DRAPE UTILITY XL STRL (DRAPES) ×3 IMPLANT
DRSG PAD ABDOMINAL 8X10 ST (GAUZE/BANDAGES/DRESSINGS) ×6 IMPLANT
ELECT REM PT RETURN 9FT ADLT (ELECTROSURGICAL) ×3
ELECTRODE REM PT RTRN 9FT ADLT (ELECTROSURGICAL) ×1 IMPLANT
EVACUATOR SILICONE 100CC (DRAIN) IMPLANT
GAUZE SPONGE 4X4 12PLY STRL (GAUZE/BANDAGES/DRESSINGS) ×6 IMPLANT
GAUZE XEROFORM 5X9 LF (GAUZE/BANDAGES/DRESSINGS) IMPLANT
GLOVE BIO SURGEON STRL SZ 6.5 (GLOVE) IMPLANT
GLOVE BIO SURGEON STRL SZ7.5 (GLOVE) IMPLANT
GLOVE BIO SURGEONS STRL SZ 6.5 (GLOVE)
GLOVE BIOGEL M STRL SZ7.5 (GLOVE) IMPLANT
GLOVE BIOGEL PI IND STRL 6.5 (GLOVE) ×3 IMPLANT
GLOVE BIOGEL PI IND STRL 8 (GLOVE) ×1 IMPLANT
GLOVE BIOGEL PI INDICATOR 6.5 (GLOVE) ×6
GLOVE BIOGEL PI INDICATOR 8 (GLOVE) ×2
GLOVE ECLIPSE 6.5 STRL STRAW (GLOVE) IMPLANT
GLOVE SURG SS PI 6.5 STRL IVOR (GLOVE) ×6 IMPLANT
GLOVE SURG SS PI 7.5 STRL IVOR (GLOVE) ×3 IMPLANT
GOWN STRL REUS W/ TWL LRG LVL3 (GOWN DISPOSABLE) ×3 IMPLANT
GOWN STRL REUS W/TWL LRG LVL3 (GOWN DISPOSABLE) ×9
MARKER SKIN DUAL TIP RULER LAB (MISCELLANEOUS) IMPLANT
NDL SAFETY ECLIPSE 18X1.5 (NEEDLE) ×1 IMPLANT
NEEDLE FILTER BLUNT 18X 1/2SAF (NEEDLE) ×2
NEEDLE FILTER BLUNT 18X1 1/2 (NEEDLE) ×1 IMPLANT
NEEDLE HYPO 18GX1.5 SHARP (NEEDLE) ×3
NEEDLE HYPO 25X1 1.5 SAFETY (NEEDLE) IMPLANT
NEEDLE SPNL 18GX3.5 QUINCKE PK (NEEDLE) ×3 IMPLANT
NS IRRIG 1000ML POUR BTL (IV SOLUTION) ×3 IMPLANT
PENCIL SMOKE EVACUATOR (MISCELLANEOUS) ×3 IMPLANT
PIN SAFETY STERILE (MISCELLANEOUS) IMPLANT
SET BASIN DAY SURGERY F.S. (CUSTOM PROCEDURE TRAY) ×3 IMPLANT
SHEET MEDIUM DRAPE 40X70 STRL (DRAPES) IMPLANT
SLEEVE SCD COMPRESS KNEE MED (MISCELLANEOUS) ×3 IMPLANT
SPONGE LAP 18X18 RF (DISPOSABLE) ×9 IMPLANT
STAPLER INSORB 30 2030 C-SECTI (MISCELLANEOUS) ×6 IMPLANT
STAPLER VISISTAT 35W (STAPLE) ×6 IMPLANT
STRIP SUTURE WOUND CLOSURE 1/2 (MISCELLANEOUS) ×9 IMPLANT
SUT CHROMIC 4 0 PS 2 18 (SUTURE) IMPLANT
SUT ETHILON 2 0 FS 18 (SUTURE) IMPLANT
SUT ETHILON 3 0 PS 1 (SUTURE) IMPLANT
SUT MNCRL AB 4-0 PS2 18 (SUTURE) ×6 IMPLANT
SUT PDS 3-0 CT2 (SUTURE) ×6
SUT PDS II 3-0 CT2 27 ABS (SUTURE) ×2 IMPLANT
SUT VIC AB 3-0 PS1 18 (SUTURE)
SUT VIC AB 3-0 PS1 18XBRD (SUTURE) IMPLANT
SUT VLOC 90 P-14 23 (SUTURE) ×6 IMPLANT
SYR 50ML LL SCALE MARK (SYRINGE) ×6 IMPLANT
SYR BULB IRRIG 60ML STRL (SYRINGE) ×3 IMPLANT
SYR CONTROL 10ML LL (SYRINGE) IMPLANT
TAPE MEASURE VINYL STERILE (MISCELLANEOUS) IMPLANT
TOWEL GREEN STERILE FF (TOWEL DISPOSABLE) ×6 IMPLANT
TRAY FOLEY W/BAG SLVR 14FR LF (SET/KITS/TRAYS/PACK) IMPLANT
TUBE CONNECTING 20'X1/4 (TUBING) ×1
TUBE CONNECTING 20X1/4 (TUBING) ×2 IMPLANT
UNDERPAD 30X36 HEAVY ABSORB (UNDERPADS AND DIAPERS) ×6 IMPLANT
YANKAUER SUCT BULB TIP NO VENT (SUCTIONS) ×3 IMPLANT

## 2020-05-18 NOTE — Transfer of Care (Signed)
Immediate Anesthesia Transfer of Care Note  Patient: Elisabet Gutzmer  Procedure(s) Performed: MAMMARY REDUCTION  (BREAST) (Bilateral Breast)  Patient Location: PACU  Anesthesia Type:General  Level of Consciousness: awake and alert   Airway & Oxygen Therapy: Patient Spontanous Breathing and Patient connected to face mask oxygen  Post-op Assessment: Report given to RN and Post -op Vital signs reviewed and stable  Post vital signs: Reviewed and stable  Last Vitals:  Vitals Value Taken Time  BP 153/93 05/18/20 1248  Temp    Pulse 80 05/18/20 1249  Resp 16 05/18/20 1249  SpO2 100 % 05/18/20 1249  Vitals shown include unvalidated device data.  Last Pain:  Vitals:   05/18/20 1004  TempSrc: Oral  PainSc: 0-No pain         Complications: No complications documented.

## 2020-05-18 NOTE — Discharge Instructions (Signed)
Activity As tolerated: NO showers for 3 days No heavy activities  Diet: Regular  Wound Care: Keep dressing clean & dry for 3 days.  Keep wrap applied with compression as much as possible 24/7.    Do not change dressings for 3 days unless soiled.  Can change if needed but make sure to reapply wrap. After three days can remove wrap and shower.  Then reapply dressings if needed and continue compression with wrap or soft sports bra. Call doctor if any unusual problems occur such as pain, excessive bleeding, unrelieved nausea/vomiting, fever &/or chills  Follow-up appointment: Scheduled for July 7.  Pain Management Plan: 1) Ibuprofen 800 mg every 6 hours (or Meloxicam as directed)          If still having pain... Add 2) Tylenol 500 mg every 6 hours           If still in pain... Add 3) Rx pain medication - Hydorcodone         Recommend @ night         May use up to every 8 hours if needed for severe pain  You may alternate between Ibuprofen and Tylenol taking one every 3 hours if desired. May use ice, avoid nipples and incisions.  Use Ibuprofen (or meloxicam) and Tylenol as first time pain management. Instructed to use Ibuprofen or Meloxicam, but not to use both.   Post Anesthesia Home Care Instructions  Activity: Get plenty of rest for the remainder of the day. A responsible individual must stay with you for 24 hours following the procedure.  For the next 24 hours, DO NOT: -Drive a car -Advertising copywriter -Drink alcoholic beverages -Take any medication unless instructed by your physician -Make any legal decisions or sign important papers.  Meals: Start with liquid foods such as gelatin or soup. Progress to regular foods as tolerated. Avoid greasy, spicy, heavy foods. If nausea and/or vomiting occur, drink only clear liquids until the nausea and/or vomiting subsides. Call your physician if vomiting continues.  Special Instructions/Symptoms: Your throat may feel dry or sore  from the anesthesia or the breathing tube placed in your throat during surgery. If this causes discomfort, gargle with warm salt water. The discomfort should disappear within 24 hours.  If you had a scopolamine patch placed behind your ear for the management of post- operative nausea and/or vomiting:  1. The medication in the patch is effective for 72 hours, after which it should be removed.  Wrap patch in a tissue and discard in the trash. Wash hands thoroughly with soap and water. 2. You may remove the patch earlier than 72 hours if you experience unpleasant side effects which may include dry mouth, dizziness or visual disturbances. 3. Avoid touching the patch. Wash your hands with soap and water after contact with the patch.    May have Tylenol at 4:30pm today 05/18/2020 May have Ibuprofen at 6:30pm today 05/18/2020

## 2020-05-18 NOTE — Anesthesia Procedure Notes (Signed)
Procedure Name: Intubation Performed by: Linkon Siverson, [place], CRNA Pre-anesthesia Checklist: Patient identified, Emergency Drugs available, Suction available and Patient being monitored Patient Re-evaluated:Patient Re-evaluated prior to induction Oxygen Delivery Method: Circle system utilized Preoxygenation: Pre-oxygenation with 100% oxygen Induction Type: IV induction Ventilation: Mask ventilation without difficulty Laryngoscope Size: Mac and 3 Grade View: Grade I Tube type: Oral Tube size: 7.0 mm Number of attempts: 1 Airway Equipment and Method: Stylet and Oral airway Placement Confirmation: ETT inserted through vocal cords under direct vision,  positive ETCO2 and breath sounds checked- equal and bilateral Tube secured with: Tape Dental Injury: Teeth and Oropharynx as per pre-operative assessment        

## 2020-05-18 NOTE — Anesthesia Postprocedure Evaluation (Signed)
Anesthesia Post Note  Patient: Rose Douglas  Procedure(s) Performed: MAMMARY REDUCTION  (BREAST) (Bilateral Breast)     Patient location during evaluation: PACU Anesthesia Type: General Level of consciousness: awake and alert Pain management: pain level controlled Vital Signs Assessment: post-procedure vital signs reviewed and stable Respiratory status: spontaneous breathing, nonlabored ventilation and respiratory function stable Cardiovascular status: blood pressure returned to baseline and stable Postop Assessment: no apparent nausea or vomiting Anesthetic complications: no   No complications documented.  Last Vitals:  Vitals:   05/18/20 1400 05/18/20 1415  BP: (!) 146/94 (!) 146/90  Pulse: 74 72  Resp: 15 15  Temp:    SpO2: 99% 100%    Last Pain:  Vitals:   05/18/20 1345  TempSrc:   PainSc: 7                  Lowella Curb

## 2020-05-18 NOTE — Interval H&P Note (Signed)
History and Physical Interval Note:  05/18/2020 9:55 AM  Rose Douglas  has presented today for surgery, with the diagnosis of macromastia.  The various methods of treatment have been discussed with the patient and family. After consideration of risks, benefits and other options for treatment, the patient has consented to  Procedure(s) with comments: MAMMARY REDUCTION  (BREAST) (Bilateral) - 150 min, please as a surgical intervention.  The patient's history has been reviewed, patient examined, no change in status, stable for surgery.  I have reviewed the patient's chart and labs.  Questions were answered to the patient's satisfaction.     [name]

## 2020-05-18 NOTE — Op Note (Signed)
Operative Note   DATE OF OPERATION: 05/18/2020  LOCATION:  SURGERY CENTER   SURGICAL DEPARTMENT: Plastic Surgery  PREOPERATIVE DIAGNOSES: Bilateral symptomatic macromastia.  POSTOPERATIVE DIAGNOSES:  same  PROCEDURE: Bilateral breast reduction with superomedial pedicle.  SURGEON: Ancil Linsey, MD  ASSISTANT: Joni Fears, PA The advanced practice practitioner (APP) assisted throughout the case.  The APP was essential in retraction and counter traction when needed to make the case progress smoothly.  This retraction and assistance made it possible to see the tissue plans for the procedure.  The assistance was needed for blood control, tissue re-approximation and assisted with closure of the incision site.  ANESTHESIA: General.  COMPLICATIONS: None.   INDICATIONS FOR PROCEDURE:  The patient, Rose Douglas is a 49 y.o. female born on 21-Jun-1971, is here for treatment of bilateral symptomatic macromastia. MRN: [id number]  CONSENT:  Informed consent was obtained directly from the patient. Risks, benefits and alternatives were fully discussed. Specific risks including but not limited to bleeding, infection, hematoma, seroma, scarring, pain, infection, contracture, asymmetry, wound healing problems, and need for further surgery were all discussed. The patient did have an ample opportunity to have questions answered to satisfaction.   DESCRIPTION OF PROCEDURE:  The patient was marked preoperatively for a Wise pattern skin excision.  The patient was taken to the operating room. SCDs were placed and antibiotics were given. General anesthesia was administered.The patient's operative site was prepped and draped in a sterile fashion. A time out was performed and all information was confirmed to be correct.  Right Breast: The breast was infiltrated with tumescent solution to help with hemostasis.  The nipple was marked with a cookie cutter.  A superomedial pedicle was drawn out  with the base of at least 8 cm in size.  A breast tourniquet was then applied and the pedicle was de-epithelialized.  Breast tourniquet was then let down and all incisions were made with a 10 blade.  The pedicle was then isolated down to the chest wall with cautery and the excision was performed removing tissue primarily inferiorly and laterally.  Hemostasis was obtained and the wound was stapled closed.  Left breast:  The breast was infiltrated with tumescent solution to help with hemostasis.  The nipple was marked with a cookie cutter.  A superomedial pedicle was drawn out with the base of at least 8 cm in size.  A breast tourniquet was then applied and the pedicle was de-epithelialized.  Breast tourniquet was then let down and all incisions were made with a 10 blade.  The pedicle was then isolated down to the chest wall with cautery and the excision was performed removing tissue primarily inferiorly and laterally.  Hemostasis was obtained and the wound was stapled closed.  Patient was then set up to check for size and symmetry.  Minor modifications were made.  This resulted in a total of 883 g removed from the right side and 900 g removed from the left side.  The inframammary incision was closed with a combination of buried in-sorb staples and a running 3-0 Quill suture.  The vertical and periareolar limbs were closed with interrupted buried 4-0 Monocryl and a running 4-0 Quill suture.  Steri-Strips were then applied along with a soft dressing and Ace wrap.  The patient tolerated the procedure well.  There were no complications. The patient was allowed to wake from anesthesia, extubated and taken to the recovery room in satisfactory condition.  I was present for the entire procedure.

## 2020-05-18 NOTE — Brief Op Note (Signed)
05/18/2020  12:34 PM  PATIENT:  Rose Douglas  49 y.o. female  PRE-OPERATIVE DIAGNOSIS:  macromastia  POST-OPERATIVE DIAGNOSIS:  macromastia  PROCEDURE:  Procedure(s) with comments: MAMMARY REDUCTION  (BREAST) (Bilateral) - 150 min, please  SURGEON:  Surgeon(s) and Role:    * Aadam Zhen, Wendy Poet, MD - Primary  PHYSICIAN ASSISTANT: Joni Fears, PA  ASSISTANTS: none   ANESTHESIA:   general  EBL:  50   BLOOD ADMINISTERED:none  DRAINS: none   LOCAL MEDICATIONS USED:  MARCAINE     SPECIMEN:  Source of Specimen:  r and l breast tissue  DISPOSITION OF SPECIMEN:  PATHOLOGY  COUNTS:  YES  TOURNIQUET:  * No tourniquets in log *  DICTATION: .Dragon Dictation  PLAN OF CARE: Discharge to home after PACU  PATIENT DISPOSITION:  PACU - hemodynamically stable.   Delay start of Pharmacological VTE agent (>24hrs) due to surgical blood loss or risk of bleeding: not applicable

## 2020-05-18 NOTE — Anesthesia Preprocedure Evaluation (Signed)
Anesthesia Evaluation  Patient identified by MRN, date of birth, ID band Patient awake    Reviewed: Allergy & Precautions, NPO status , Patient's Chart, lab work & pertinent test results  Airway Mallampati: II  TM Distance: >3 FB     Dental  (+) Dental Advisory Given   Pulmonary asthma , Current Smoker and Patient abstained from smoking.,    breath sounds clear to auscultation       Cardiovascular negative cardio ROS   Rhythm:Regular Rate:Normal     Neuro/Psych negative neurological ROS     GI/Hepatic Neg liver ROS, GERD  ,  Endo/Other  negative endocrine ROS  Renal/GU negative Renal ROS     Musculoskeletal   Abdominal   Peds  Hematology negative hematology ROS (+)   Anesthesia Other Findings   Reproductive/Obstetrics                             Anesthesia Physical Anesthesia Plan  ASA: II  Anesthesia Plan: General   Post-op Pain Management:    Induction: Intravenous  PONV Risk Score and Plan: 2 and Dexamethasone, Ondansetron and Treatment may vary due to age or medical condition  Airway Management Planned: Oral ETT  Additional Equipment: None  Intra-op Plan:   Post-operative Plan: Extubation in OR  Informed Consent: I have reviewed the patients History and Physical, chart, labs and discussed the procedure including the risks, benefits and alternatives for the proposed anesthesia with the patient or authorized representative who has indicated his/her understanding and acceptance.     Dental advisory given  Plan Discussed with: CRNA  Anesthesia Plan Comments:         Anesthesia Quick Evaluation

## 2020-05-19 ENCOUNTER — Encounter (HOSPITAL_BASED_OUTPATIENT_CLINIC_OR_DEPARTMENT_OTHER): Payer: Self-pay | Admitting: Plastic Surgery

## 2020-05-19 LAB — SURGICAL PATHOLOGY

## 2020-05-26 ENCOUNTER — Ambulatory Visit (INDEPENDENT_AMBULATORY_CARE_PROVIDER_SITE_OTHER): Payer: 59 | Admitting: Plastic Surgery

## 2020-05-26 ENCOUNTER — Encounter: Payer: Self-pay | Admitting: Plastic Surgery

## 2020-05-26 ENCOUNTER — Other Ambulatory Visit: Payer: Self-pay

## 2020-05-26 VITALS — BP 154/94 | HR 64 | Temp 98.4°F

## 2020-05-26 DIAGNOSIS — N62 Hypertrophy of breast: Secondary | ICD-10-CM

## 2020-05-26 NOTE — Progress Notes (Signed)
Patient is here postop from bilateral breast reduction.  Overall she is happy with her progress.  She does report intermittent pain and some tenderness along the incisions when she is a little bit more active.  She is noticing a slightly larger prominence in the right axilla compared to the left when she looks at it from the back.  On exam everything looks to be healing well.  She has expected amount of bruising.  Nipple areolar complexes are viable.  Her shape and symmetry are quite good at this point.  She does have a little bit of a dogear extending into the posterior axilla on the right side but hopefully this will calm down with time.  We will plan to see her again in 3 to 4 weeks.

## 2020-06-02 ENCOUNTER — Encounter: Payer: 59 | Admitting: Surgical

## 2020-06-16 ENCOUNTER — Other Ambulatory Visit: Payer: Self-pay

## 2020-06-16 ENCOUNTER — Encounter: Payer: Self-pay | Admitting: Surgical

## 2020-06-16 ENCOUNTER — Ambulatory Visit (INDEPENDENT_AMBULATORY_CARE_PROVIDER_SITE_OTHER): Payer: 59 | Admitting: Surgical

## 2020-06-16 VITALS — BP 126/84 | HR 85 | Temp 98.1°F

## 2020-06-16 DIAGNOSIS — M4004 Postural kyphosis, thoracic region: Secondary | ICD-10-CM

## 2020-06-16 DIAGNOSIS — Z719 Counseling, unspecified: Secondary | ICD-10-CM

## 2020-06-16 DIAGNOSIS — M545 Low back pain, unspecified: Secondary | ICD-10-CM

## 2020-06-16 DIAGNOSIS — M546 Pain in thoracic spine: Secondary | ICD-10-CM

## 2020-06-16 DIAGNOSIS — N62 Hypertrophy of breast: Secondary | ICD-10-CM

## 2020-06-16 DIAGNOSIS — Z9889 Other specified postprocedural states: Secondary | ICD-10-CM

## 2020-06-16 NOTE — Progress Notes (Signed)
Patient is a 49 yo female here for follow up after bilateral breast reduction with Dr. Arita Miss on 05/18/20.  Patient reports she is overall doing well.  She does note that she has noticed some burning sensation in her bilateral nipple areolar complexes as well as some shooting pains.  She is not having any fevers, chills, nausea, vomiting.  Chaperone present on exam On exam bilateral NAC's are viable with good color, incisions are C/D/I.  She has a small scab noted at the lateral aspect of her right breast.  Slightly thickened scars of the right lateral and left lateral breast.  They are not keloiding, but slightly hypertrophic.   Discussed with patient use of scar creams for improving the appearance of the scars.  Also discussed silicone scar sheets for hypertrophic scarring along the left lateral right lateral breast.   Also discussed with patient that sometimes the burning and shooting pains is normal and this is likely nerves regrowing.  I do not see anything concerning on exam.  There is no sign of any infection, seroma, hematoma.  Recommend continuing wear sports bra 24/7 for 2 more weeks.  She can then transition into wearing it just during the day.  Avoid heavy lifting greater than 15 pounds for 2 more weeks.  Call with questions or concerns.  Follow-up in 2 months.

## 2020-06-25 ENCOUNTER — Telehealth: Payer: Self-pay | Admitting: Internal Medicine

## 2020-06-25 NOTE — Telephone Encounter (Signed)
Please advise 

## 2020-06-25 NOTE — Telephone Encounter (Signed)
Patient called in and requested to speak with pcp regarding the covid vaccine. Patient stated that she recently had a surgery and wanted to know if it was too soon to get the vaccine. Please follow up at your earliest convenience.

## 2020-06-28 NOTE — Telephone Encounter (Signed)
I do not see any contraindication in her chart that would concern me about her getting the vaccination in the post operative period. I would strongly encourage her to get vaccinated as soon as she is able to.   Marcy Siren, D.O. Primary Care at [facility]  06/28/2020, 10:25 AM

## 2020-06-28 NOTE — Telephone Encounter (Signed)
Patient notified

## 2020-07-15 ENCOUNTER — Ambulatory Visit (INDEPENDENT_AMBULATORY_CARE_PROVIDER_SITE_OTHER): Payer: 59 | Admitting: Internal Medicine

## 2020-07-15 ENCOUNTER — Encounter: Payer: Self-pay | Admitting: Internal Medicine

## 2020-07-15 ENCOUNTER — Other Ambulatory Visit: Payer: 59

## 2020-07-15 DIAGNOSIS — R1013 Epigastric pain: Secondary | ICD-10-CM | POA: Diagnosis not present

## 2020-07-15 MED ORDER — PANTOPRAZOLE SODIUM 40 MG PO TBEC: 40.0000 mg | DELAYED_RELEASE_TABLET | Freq: Every day | ORAL | 1 refills | Status: DC

## 2020-07-15 NOTE — Progress Notes (Signed)
Virtual Visit via Telephone Note  I connected with Tiyana Galla, on 07/15/2020 at 9:46 AM by telephone due to the COVID-19 pandemic and verified that I am speaking with the correct person using two identifiers.   Consent: I discussed the limitations, risks, security and privacy concerns of performing an evaluation and management service by telephone and the availability of in person appointments. I also discussed with the patient that there may be a patient responsible charge related to this service. The patient expressed understanding and agreed to proceed.   Location of Patient: Home   Location of Provider: Clinic    Persons participating in Telemedicine visit: Makailee Nudelman [facility] Dr. Earlene Plater      History of Present Illness: Patient has a visit for concerns about stomach cramping. She reports that she is lactose intolerant---feels like symptoms started after she ate cheese. Reports abdominal cramping became so severe she couldn't sit at her desk. They started having some burning and "major discomfort" in epigastric region. Feels similar to when she's had a stomach ulcer in the past. Pain is constant throughout the day. Has had some associated nausea---started taking anti emetic that was previously prescribed for a surgery. No vomiting, diarrhea, fever. Reports lots of belching and passing gas. Gas-X has helped some. No hematochezia or melena.    Past Medical History:  Diagnosis Date   Anxiety    Asthma    no meds   Class 1 obesity due to excess calories in adult    GERD (gastroesophageal reflux disease)    History of stomach ulcers    History of ventricular fibrillation    had total hysterectomy, pt denies   Moderate depressive disorder    Seasonal allergies    Vitamin D deficiency    Allergies  Allergen Reactions   Latex Rash    No current outpatient medications on file prior to visit.   No current facility-administered medications on file prior  to visit.    Observations/Objective: NAD. Speaking clearly.  Work of breathing normal.  Alert and oriented. Mood appropriate.   Assessment and Plan: 1. Epigastric pain Sounds consistent with GERD or possibly gastric ulcer. Will obtain testing for H. Pylori. Will treat with continuous PPI for at least one month for acid suppression. No red flag symptoms for perforation or GI bleed. Obtain CBC to ensure HgB stable. Patient has a lot of anxiety over food choices--will obtain allergen testing.  - pantoprazole (PROTONIX) 40 MG tablet; Take 1 tablet (40 mg total) by mouth daily.  Dispense: 30 tablet; Refill: 1 - Allergen food profile specific IgE - CBC - H. pylori breath test  Follow Up Instructions: Lab visit today    I discussed the assessment and treatment plan with the patient. The patient was provided an opportunity to ask questions and all were answered. The patient agreed with the plan and demonstrated an understanding of the instructions.   The patient was advised to call back or seek an in-person evaluation if the symptoms worsen or if the condition fails to improve as anticipated.     I provided 35 minutes total of non-face-to-face time during this encounter including median intraservice time, reviewing previous notes, investigations, ordering medications, medical decision making, coordinating care and patient verbalized understanding at the end of the visit.    Marcy Siren, D.O. Primary Care at [facility]  07/15/2020, 9:46 AM

## 2020-07-16 LAB — H. PYLORI BREATH TEST: H pylori Breath Test: NEGATIVE

## 2020-07-18 LAB — CBC
Hematocrit: 44.7 % (ref 34.0–46.6)
Hemoglobin: 13.7 g/dL (ref 11.1–15.9)
MCH: 24.3 pg — ABNORMAL LOW (ref 26.6–33.0)
MCHC: 30.6 g/dL — ABNORMAL LOW (ref 31.5–35.7)
MCV: 79 fL (ref 79–97)
Platelets: 205 10*3/uL (ref 150–450)
RBC: 5.64 x10E6/uL — ABNORMAL HIGH (ref 3.77–5.28)
RDW: 14.6 % (ref 11.7–15.4)
WBC: 5.8 10*3/uL (ref 3.4–10.8)

## 2020-07-18 LAB — ALLERGEN FOOD PROFILE SPECIFIC IGE
Allergen Apple, IgE: <0.1 kU/L
Allergen Corn, IgE: <0.1 kU/L
Allergen Tomato, IgE: <0.1 kU/L
Chicken IgE: <0.1 kU/L
Codfish IgE: <0.1 kU/L
Egg White IgE: <0.1 kU/L
IgE (Immunoglobulin E), Serum: 25 IU/mL (ref 6–495)
Milk IgE: <0.1 kU/L
Orange: <0.1 kU/L
Peanut IgE: <0.1 kU/L
Shrimp IgE: <0.1 kU/L
Soybean IgE: <0.1 kU/L
Tuna: <0.1 kU/L
Wheat IgE: <0.1 kU/L

## 2020-08-09 ENCOUNTER — Other Ambulatory Visit: Payer: Self-pay

## 2020-08-09 DIAGNOSIS — R1013 Epigastric pain: Secondary | ICD-10-CM

## 2020-08-09 MED ORDER — PANTOPRAZOLE SODIUM 40 MG PO TBEC: 40.0000 mg | DELAYED_RELEASE_TABLET | Freq: Every day | ORAL | 0 refills | Status: DC

## 2020-08-19 ENCOUNTER — Ambulatory Visit: Payer: 59 | Admitting: Surgical

## 2020-08-19 ENCOUNTER — Encounter: Payer: Self-pay | Admitting: Plastic Surgery

## 2020-08-19 ENCOUNTER — Other Ambulatory Visit: Payer: Self-pay

## 2020-08-19 ENCOUNTER — Ambulatory Visit (INDEPENDENT_AMBULATORY_CARE_PROVIDER_SITE_OTHER): Payer: 59 | Admitting: Plastic Surgery

## 2020-08-19 VITALS — BP 120/72 | HR 60 | Temp 98.5°F

## 2020-08-19 DIAGNOSIS — N62 Hypertrophy of breast: Secondary | ICD-10-CM

## 2020-08-19 NOTE — Progress Notes (Signed)
   Referring Provider Arvilla Market, DO [address] [place],  [place] [address]   CC:  Chief Complaint  Patient presents with  . Follow-up      Rose Douglas is an 49 y.o. female.  HPI: Patient presents 3 months out from bilateral breast reduction.  She overall feels good with the result but is bothered by the scar in the number of locations.  She is particularly bothered by the scar as it extends into her axilla and reports pain and sensitivity in this area.  She also notices an area of redness and firmness around the periareolar scar on the right side that bothers her.  Otherwise she feels like she is recovering well.  Review of Systems General: Denies fevers and chills  Physical Exam Vitals with BMI 08/19/2020 06/16/2020 05/26/2020  Height - - -  Weight - - -  BMI - - -  Systolic 120 126 263  Diastolic 72 84 94  Pulse 60 85 64    General:  No acute distress,  Alert and oriented, Non-Toxic, Normal speech and affect On examination she has well-healed incisions from her breast reduction.  Her size and shape are good and symmetric.  Nipple areolar complexes are viable and sensate.  She does have a bit of thickening to the scar in the superior aspect of the areola on the right side.  She also has widening and sensitivity of the scars as they extend towards her axilla.  They are not growing beyond the borders but have certainly hypertrophied.  Assessment/Plan Patient presents 3 months out from a breast reduction with sensitive hypertrophic scars in a few areas along her incisions.  I offered steroid injection and she wants to think about this.  Alternatively she could also try silicone sheeting as a way to help with the scars and this would also place a barrier between her skin and her bra which may help with the sensitivity as well.  She is going to try these things and let us know.  All of her other questions were answered and she will follow up with [place] on an as-needed  basis.  Rose Douglas 08/19/2020, 1:00 PM

## 2020-11-23 ENCOUNTER — Other Ambulatory Visit: Payer: Self-pay | Admitting: Internal Medicine

## 2020-11-23 ENCOUNTER — Telehealth: Payer: Self-pay | Admitting: Internal Medicine

## 2020-11-23 DIAGNOSIS — R1013 Epigastric pain: Secondary | ICD-10-CM

## 2020-11-23 MED ORDER — PANTOPRAZOLE SODIUM 40 MG PO TBEC: 40.0000 mg | DELAYED_RELEASE_TABLET | Freq: Every day | ORAL | 0 refills | Status: AC

## 2020-11-23 NOTE — Telephone Encounter (Signed)
I only see Protonix on her medication list so I presume she is referring to that medication. I sent a refill of that but would you please clarify that was the refill she needed?   Marcy Siren, D.O. Primary Care at [facility]  11/23/2020, 4:12 PM

## 2020-11-23 NOTE — Telephone Encounter (Signed)
Patient is requesting refills of her stomach medication. Please follow up.

## 2020-11-25 NOTE — Telephone Encounter (Signed)
Called patient to follow up. Patient states that she picked up medication and did not recognize the name. Stated that she thought medication started with a C and that you were the only provider who had prescribed it.   I let patient know that was the only medication on her current med list that we were able to see. Patient stated the medication was previously prescribed for ulcers and that this must be the correct medication. Patient thanked me for follow-up call.

## 2021-05-01 IMAGING — MG MM DIGITAL DIAGNOSTIC UNILAT*L* W/ TOMO W/ CAD
8 series · 8 of 24 positions shown · non-contrast
Comparison: Previous exam(s).

CLINICAL DATA: 48-year-old female recalled from baseline 2D
mammogram dated 05/10/2020 for a possible left breast asymmetry.

EXAM:
DIGITAL DIAGNOSTIC LEFT MAMMOGRAM WITH CAD AND TOMO
ULTRASOUND LEFT BREAST

[L MLO synth-2D (1 of 3)]
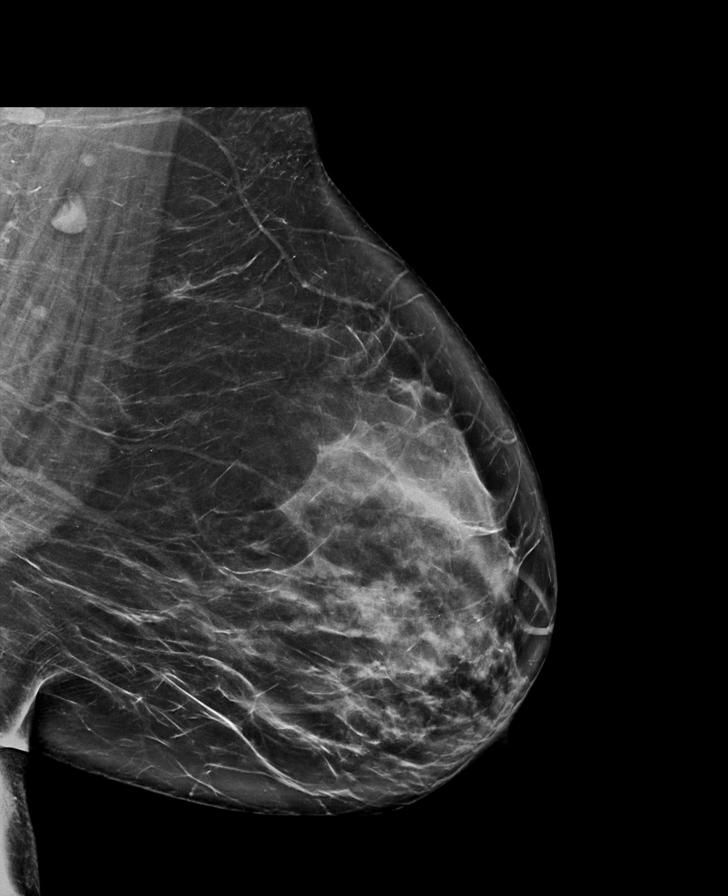

[L MLO synth-2D (2 of 3)]
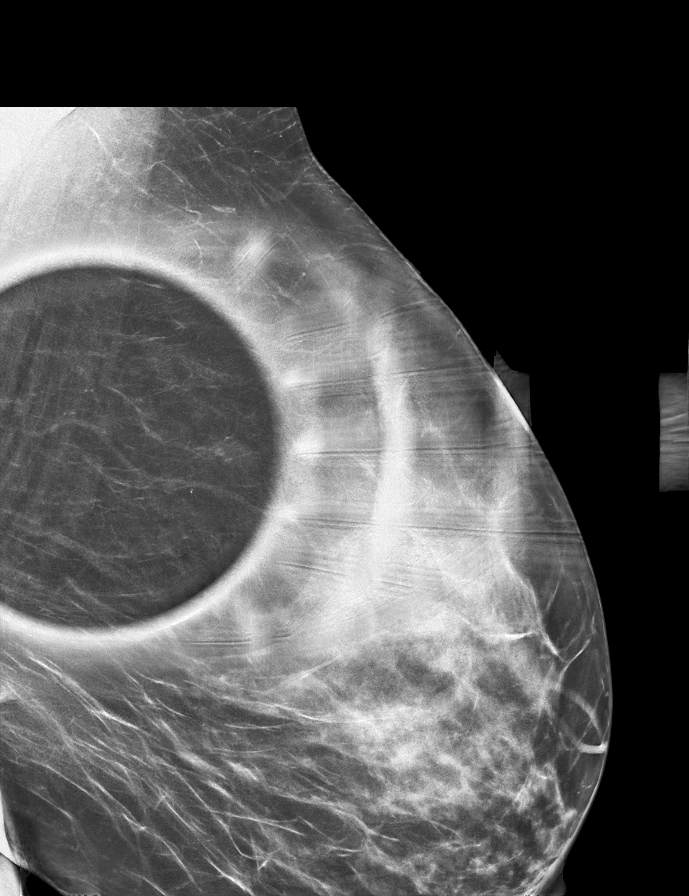

[L MLO synth-2D (3 of 3)]
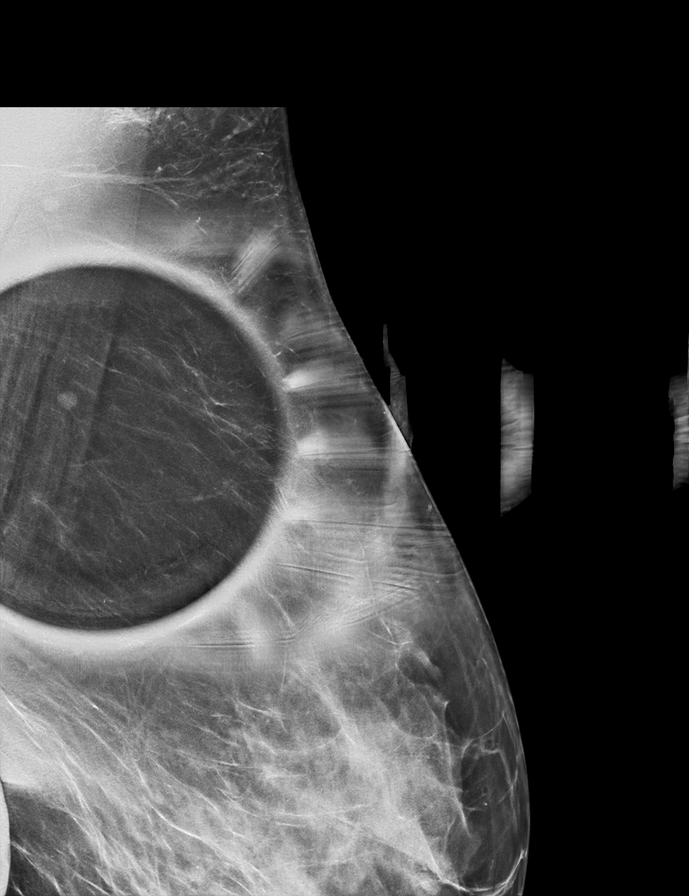

[L CC synth-2D]
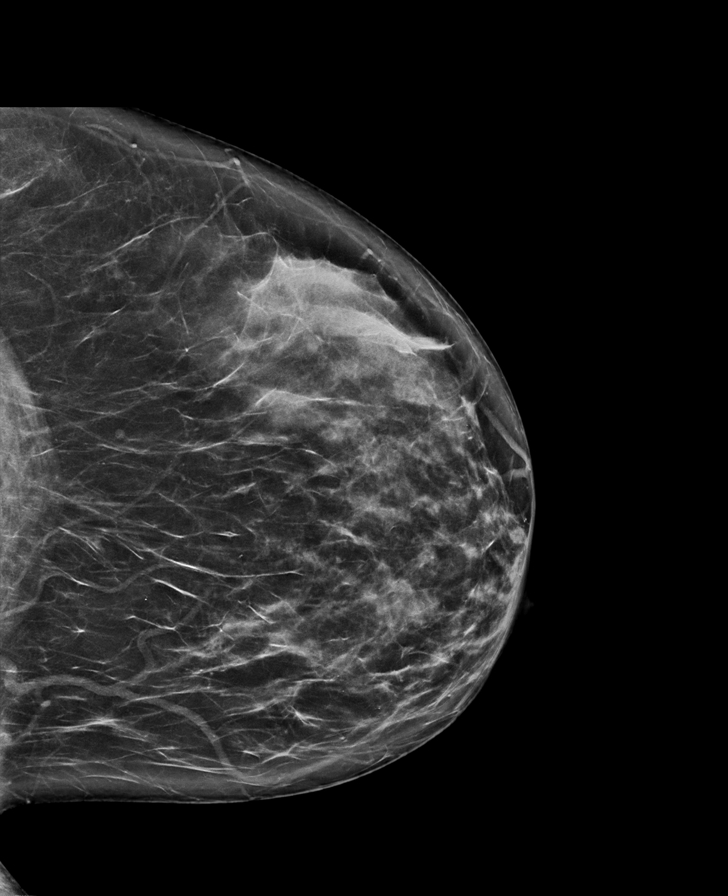

[L MLO tomo (1 of 3) · tomo slice 40/79.0]
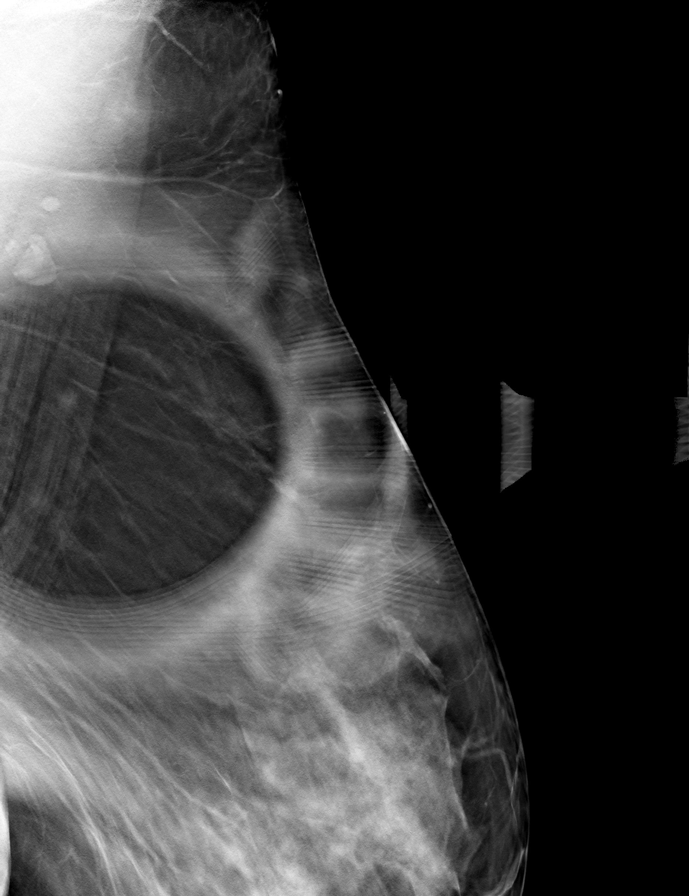

[L MLO tomo (2 of 3) · tomo slice 37/73.0]
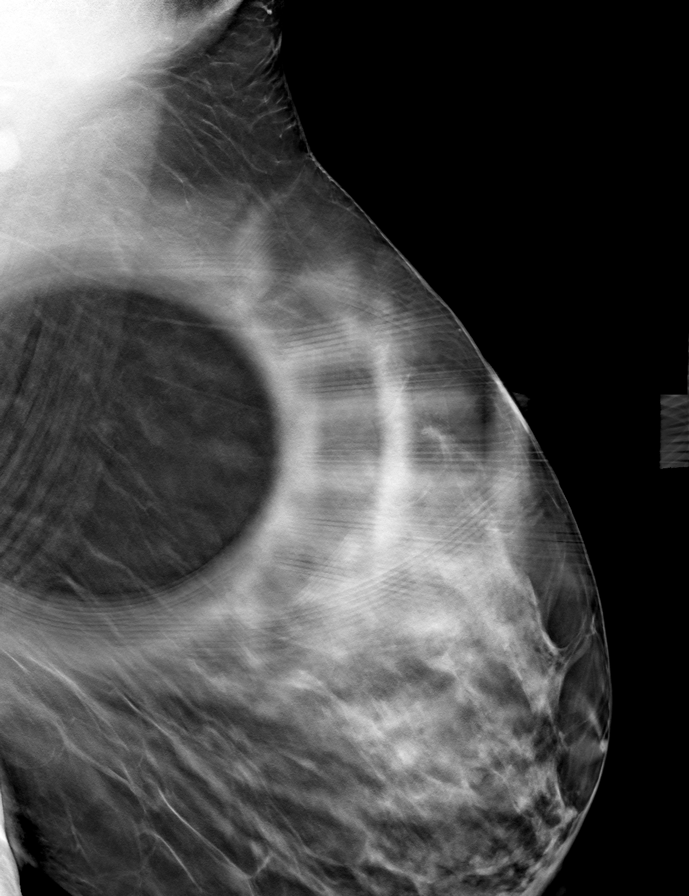

[L CC tomo · tomo slice 39/76.0]
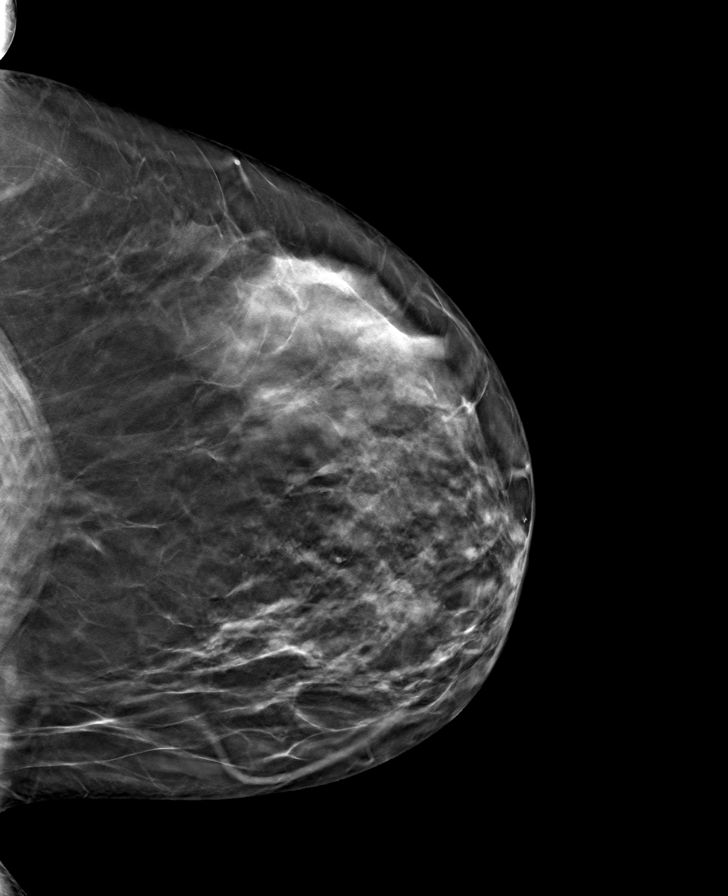

[L MLO tomo (3 of 3) · tomo slice 45/90.0]
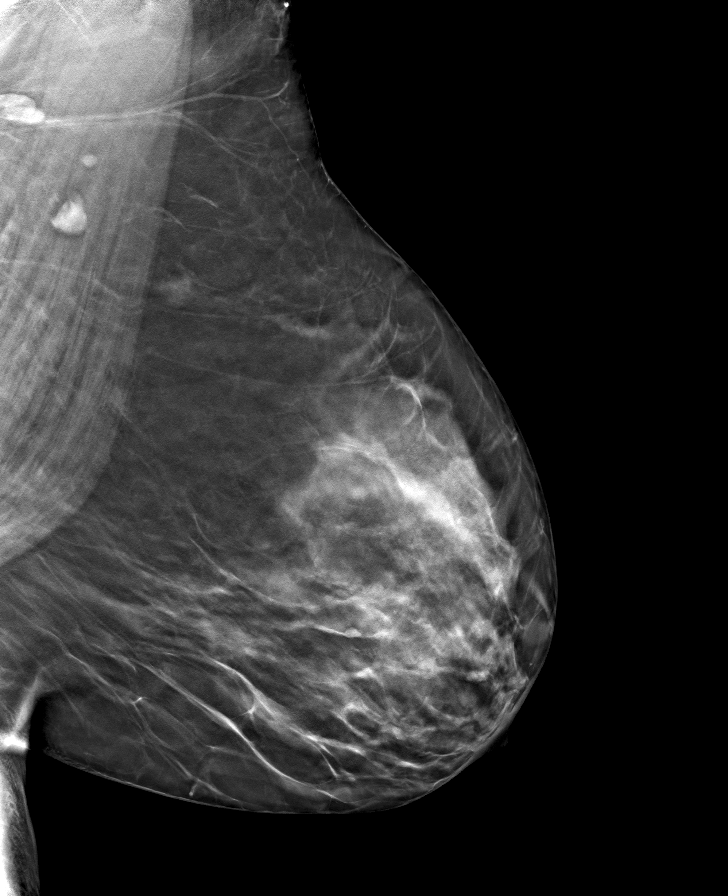

[8 of 24 positions shown; findings below may reference images not displayed]

ACR Breast Density Category b: There are scattered areas of
fibroglandular density.
FINDINGS: Previously described, possible asymmetry in the superior left breast
at far posterior depth on the MLO projection effaces into well
dispersed fibroglandular tissue on today's additional views.
Precautionary ultrasound was performed.

Mammographic images were processed with CAD.

Targeted ultrasound is performed, showing an island of focal
fibroglandular tissue at the 1 o'clock position 12 cm from the
nipple. This may correspond with the mammographic finding. No
suspicious mammographic findings are identified in the upper outer
quadrant and low left axilla. A few morphologically normal lymph
nodes are noted.
IMPRESSION: No persistent, suspicious mammographic or sonographic findings in
the left breast.

RECOMMENDATION:
Screening mammogram in one year.(Code:A1-4-NEU)

I have discussed the findings and recommendations with the patient.
If applicable, a reminder letter will be sent to the patient
regarding the next appointment.

BI-RADS CATEGORY  1: Negative.

## 2022-04-12 ENCOUNTER — Telehealth: Payer: Self-pay | Admitting: Physician Assistant

## 2022-04-12 NOTE — Telephone Encounter (Signed)
Scheduled appt per 5/23 referral. Pt is aware of appt date and time. Pt is aware to arrive 15 mins prior to appt time and to bring and updated insurance card. Pt is aware of appt location.   

## 2022-04-18 ENCOUNTER — Ambulatory Visit (INDEPENDENT_AMBULATORY_CARE_PROVIDER_SITE_OTHER): Payer: 59 | Admitting: Psychiatry

## 2022-04-18 ENCOUNTER — Encounter: Payer: Self-pay | Admitting: *Deleted

## 2022-04-18 ENCOUNTER — Other Ambulatory Visit: Payer: Self-pay | Admitting: *Deleted

## 2022-04-18 ENCOUNTER — Encounter: Payer: Self-pay | Admitting: Psychiatry

## 2022-04-18 VITALS — BP 134/87 | HR 65 | Ht 66.5 in | Wt 201.8 lb

## 2022-04-18 DIAGNOSIS — R519 Headache, unspecified: Secondary | ICD-10-CM

## 2022-04-18 DIAGNOSIS — R202 Paresthesia of skin: Secondary | ICD-10-CM | POA: Diagnosis not present

## 2022-04-18 MED ORDER — RIZATRIPTAN BENZOATE 10 MG PO TABS: 10.0000 mg | ORAL_TABLET | ORAL | 5 refills | Status: AC | PRN

## 2022-04-18 MED ORDER — DULOXETINE HCL 30 MG PO CPEP: 30.0000 mg | ORAL_CAPSULE | Freq: Every day | ORAL | 5 refills | Status: DC

## 2022-04-18 NOTE — Patient Instructions (Addendum)
Plan: Start Cymbalta 30 mg daily for headache prevention Start rizatriptan. Take at the onset of migraine. If headache recurs or does not fully resolve, you may take a second dose after 2 hours.  Brain MRI  GENERAL HEADACHE INSTRUCTIONS Headache Preventive Treatment: Please keep in mind that it takes 4-6 weeks for the medication to start working well and 2-3 months at the appropriate dose before deciding if it will be useful or not. If it is not helping at all by this time, then we will discuss other medications to try. Supplements may take 3-6 months until you see full effect.   Natural supplements: Magnesium Oxide or Magnesium Glycinate 500 mg at bed (up to 800 mg daily) Coenzyme Q10 300 mg in AM Vitamin B2- 200 mg twice a day  Add 1 supplement at a time since even natural supplements can have undesirable side effects. You can sometimes buy supplements cheaper (especially Coenzyme Q10) at www.https://compton-perez.com/ or at [employer].  Vitamins and herbs that show potential:  Magnesium: Magnesium (250 mg twice a day or 500 mg at bed) has a relaxant effect on smooth muscles such as blood vessels. Individuals suffering from frequent or daily headache usually have low magnesium levels which can be increase with daily supplementation of 400-750 mg. Three trials found 40-90% average headache reduction  when used as a preventative. Magnesium also demonstrated the benefit in menstrually related migraine.  Magnesium is part of the messenger system in the serotonin cascade and it is a good muscle relaxant.  It is also useful for constipation which can be a side effect of other medications used to treat migraine. Good sources include nuts, whole grains, and tomatoes. Side Effects: loose stool/diarrhea Riboflavin (vitamin B 2) 200 mg twice a day. This vitamin assists nerve cells in the production of ATP a principal energy storing molecule.  It is necessary for many chemical reactions in the body.  There have been at least 3  clinical trials of riboflavin using 400 mg per day all of which suggested that migraine frequency can be decreased.  All 3 trials showed significant improvement in over half of migraine sufferers.  The supplement is found in bread, cereal, milk, meat, and poultry.  Most Americans get more riboflavin than the recommended daily allowance, however riboflavin deficiency is not necessary for the supplements to help prevent headache. Side effects: energizing, green urine  Coenzyme Q10: This is present in almost all cells in the body and is critical component for the conversion of energy.  Recent studies have shown that a nutritional supplement of CoQ10 can reduce the frequency of migraine attacks by improving the energy production of cells as with riboflavin.  Doses of 150 mg twice a day have been shown to be effective.  Melatonin: Increasing evidence shows correlation between melatonin secretion and headache conditions.  Melatonin supplementation has decreased headache intensity and duration.  It is widely used as a sleep aid.  Sleep is natures way of dealing with migraine.  A dose of 3 mg is recommended to start for headaches including cluster headache. Higher doses up to 15 mg has been reviewed for use in Cluster headache and have been used. The rationale behind using melatonin for cluster is that many theories regarding the cause of Cluster headache center around the disruption of the normal circadian rhythm in the brain.  This helps restore the normal circadian rhythm.  Ginger: Ginger has a small amount of antihistamine and anti-inflammatory action which may help headache.  It is primarily  used for nausea and may aid in the absorption of other medications. HEADACHE DIET: Foods and beverages which may trigger migraine Note that only 20% of headache patients are food sensitive. You will know if you are food sensitive if you get a headache consistently 20 minutes to 2 hours after eating a certain food. Only cut  out a food if it causes headaches, otherwise you might remove foods you enjoy! What matters most for diet is to eat a well balanced healthy diet full of vegetables and low fat protein, and to not miss meals.  Chocolate, other sweets ALL cheeses except cottage and cream cheese Dairy products, yogurt, sour cream, ice cream Liver Meat extracts (Bovril, Marmite, meat tenderizers) Meats or fish which have undergone aging, fermenting, pickling or smoking. These include: Hotdogs,salami,Lox,sausage, mortadellas,smoked salmon, pepperoni, Pickled herring Pods of broad bean (English beans, Chinese pea pods, [place] (fava) beans, lima and navy beans Ripe avocado, ripe banana Yeast extracts or active yeast preparations such as Brewer's or Fleishman's (commercial bakes goods are permitted) Tomato based foods, pizza (lasagna, etc.)  MSG (monosodium glutamate) is disguised as many things; look for these common aliases: Monopotassium glutamate Autolysed yeast Hydrolysed protein Sodium caseinate "flavorings" "all natural preservatives" Nutrasweet  Avoid all other foods that convincingly provoke headaches.  Resources: The Dizzy Lu Duffel Your Headache Diet, migrainestrong.com  https://www.aguirre.org/  Caffeine and Migraine For patients that have migraine, caffeine intake more than 3 days per week can lead to dependency and increased migraine frequency. I would recommend cutting back on your caffeine intake as best you can. The recommended amount of caffeine is 200-300 mg daily, although migraine patients may experience dependency at even lower doses. While you may notice an increase in headache temporarily, cutting back will be helpful for headaches in the long run. For more information on caffeine and migraine, visit: https://americanmigrainefoundation.org/resource-library/caffeine-and-migraine/  Headache Prevention Strategies:  1. Maintain a headache  diary; learn to identify and avoid triggers.  - This can be a simple note where you log when you had a headache, associated symptoms, and medications used - There are several smartphone apps developed to help track migraines: Migraine Buddy, Migraine Monitor, Curelator N1-Headache App  Common triggers include: Emotional triggers: Emotional/Upset family or friends Emotional/Upset occupation Business reversal/success Anticipation anxiety Crisis-serious Post-crisis periodNew job/position   Physical triggers: Vacation Day Weekend Strenuous Exercise High Altitude Location New Move Menstrual Day Physical Illness Oversleep/Not enough sleep Weather changes Light: Photophobia or light sesnitivity treatment involves a balance between desensitization and reduction in overly strong input. Use dark polarized glasses outside, but not inside. Avoid bright or fluorescent light, but do not dim environment to the point that going into a normally lit room hurts. Consider FL-41 tint lenses, which reduce the most irritating wavelengths without blocking too much light.  These can be obtained at axonoptics.com or theraspecs.com Foods: see list above.  2. Limit use of acute treatments (over-the-counter medications, triptans, etc.) to no more than 2 days per week or 10 days per month to prevent medication overuse headache (rebound headache).    3. Follow a regular schedule (including weekends and holidays): Don't skip meals. Eat a balanced diet. 8 hours of sleep nightly. Minimize stress. Exercise 30 minutes per day. Being overweight is associated with a 5 times increased risk of chronic migraine. Keep well hydrated and drink 6-8 glasses of water per day.  4. Initiate non-pharmacologic measures at the earliest onset of your headache. Rest and quiet environment. Relax and reduce stress. Breathe2Relax is a free  app that can instruct you on    some simple relaxtion and breathing techniques.  Http://Dawnbuse.com is a    free website that provides teaching videos on relaxation.  Also, there are  many apps that   can be downloaded for "mindful" relaxation.  An app called YOGA NIDRA will help walk you through mindfulness. Another app called Calm can be downloaded to give you a structured mindfulness guide with daily reminders and skill development. Headspace for guided meditation Mindfulness Based Stress Reduction Online Course: www.palousemindfulness.com Cold compresses.  5. Don't wait!! Take the maximum allowable dosage of prescribed medication at the first sign of migraine.  6. Compliance:  Take prescribed medication regularly as directed and at the first sign of a migraine.  7. Communicate:  Call your physician when problems arise, especially if your headaches change, increase in frequency/severity, or become associated with neurological symptoms (weakness, numbness, slurred speech, etc.).  8. Headache/pain management therapies: Consider various complementary methods, including medication, behavioral therapy, psychological counselling, biofeedback, massage therapy, acupuncture, dry needling, and other modalities.  Such measures may reduce the need for medications. Counseling for pain management, where patients learn to function and ignore/minimize their pain, seems to work very well.  9. Recommend changing family's attention and focus away from patient's headaches. Instead, emphasize daily activities. If first question of day is 'How are your headaches/Do you have a headache today?', then patient will constantly think about headaches, thus making them worse. Goal is to re-direct attention away from headaches, toward daily activities and other distractions.  10. Helpful Websites: www.AmericanHeadacheSociety.org VoipObserver.it www.headaches.org GolfingFamily.no www.achenet.org

## 2022-04-18 NOTE — Progress Notes (Signed)
Referring:  Stamey, Girtha Rm, FNP [place],  [place] [id number]  PCP: Nicolette Bang, MD  Neurology was asked to evaluate Rose Douglas, a 51 year old female for a chief complaint of headaches.  Our recommendations of care will be communicated by shared medical record.    CC:  headaches, brain fog, blurred vision  History provided from self  HPI:  Medical co-morbidities: HSV, frequent sinus infections  The patient presents for evaluation of headaches which began 2 months ago. Headaches have become more severe and more frequent in the past 2 weeks. They are described as pressure in her ears and behind her eyes. Feels like her head is in a vice. Denies photophobia or phonophobia, but it can rarely be associated with nausea. Headaches can last multiple days at a time. She has seen floaters in her vision and vision seems blurrier when she has a headache. She will also have brain fog and word finding difficulty. She also neck stiffness and leg pains radiating from her low back down to her feet. Her whole body aches and her scalp is sensitive to the touch. Has intermittent paresthesias in her L>R hand. She is not sure if this associated with her headache or not.  She has also become more emotional and has noted random crying spells. Wonders if she may be perimenopausal.  She has tried Tylenol as needed which only helps a little. Caffeine helps. She was prescribed Fiorinal recently which does help.  Denies history of migraines but does report frequent sinus headaches.   Headache History: Onset: 2 months ago Triggers: moving head quickly Aura: floating dots, blurry Location: holocephalic Quality/Description: pressure, vice-like squeezing Associated Symptoms:  Photophobia: no  Phonophobia: no  Nausea: one time Worse with activity?: yes Duration of headaches: several days  Headache days per month: 15 Headache free days per month: 15  Current  Treatment: Abortive Fiorinal  Preventative none  Prior Therapies                                 Fiorinal   LABS: 04/06/22: HSV IgG (+), CBC with RBC 5.68. ANA, ESR, CRP, RF, TSH, Vitamin B12, CMP wnl  IMAGING:  none   Current Outpatient Medications on File Prior to Visit  Medication Sig Dispense Refill   pantoprazole (PROTONIX) 40 MG tablet Take 1 tablet (40 mg total) by mouth daily. 90 tablet 0   No current facility-administered medications on file prior to visit.     Allergies: Allergies  Allergen Reactions   Latex Rash    hives    Family History: Migraine or other headaches in the family:  no Aneurysms in a first degree relative:  no Brain tumors in the family:  no Other neurological illness in the family:   no  Past Medical History: Past Medical History:  Diagnosis Date   Anxiety    Asthma    no meds   Class 1 obesity due to excess calories in adult    Depression    Frequent headaches    GERD (gastroesophageal reflux disease)    History of stomach ulcers    History of ventricular fibrillation    had total hysterectomy, pt denies   Moderate depressive disorder    Seasonal allergies    Vitamin D deficiency     Past Surgical History Past Surgical History:  Procedure Laterality Date   BREAST REDUCTION SURGERY Bilateral  05/18/2020   Procedure: MAMMARY REDUCTION  (BREAST);  Surgeon: Pace, Collier S, MD;  Location: [facility];  Service: Plastics;  Laterality: Bilateral;  150 min, please   TOTAL ABDOMINAL HYSTERECTOMY     TUBAL LIGATION      Social History: Social History   Tobacco Use   Smoking status: Former    Types: Cigarettes, Cigars   Smokeless tobacco: Never   Tobacco comments:    periodically, has not had any in 2 weeks,04/18/22 casual cigar smoker  Substance Use Topics   Alcohol use: Yes    Comment: weekends   Drug use: Not Currently    Comment: CBD    ROS: Negative for fevers, chills. Positive for headaches, brain  fog, imbalance. All other systems reviewed and negative unless stated otherwise in HPI.   Physical Exam:   Vital Signs: BP 134/87   Pulse 65   Ht 5' 6.5" (1.689 m)   Wt 201 lb 12.8 oz (91.5 kg)   BMI 32.08 kg/m  GENERAL: well appearing,in no acute distress,alert SKIN:  Color, texture, turgor normal. No rashes or lesions HEAD:  Normocephalic/atraumatic. CV:  RRR RESP: Normal respiratory effort MSK: +tenderness to palpation over bilateral temples, right occiput  NEUROLOGICAL: Mental Status: Alert, oriented to person, place and time,Follows commands Cranial Nerves: PERRL, optic discs sharp OU, visual fields intact to confrontation, extraocular movements intact, facial sensation intact, no facial droop or ptosis, hearing grossly intact, no dysarthria Motor: muscle strength 5/5 both upper and lower extremities Reflexes: 2+ throughout Sensation: intact to light touch all 4 extremities Coordination: Finger-to- nose-finger intact bilaterally Gait: normal-based   IMPRESSION: 50 year old female with a history of HSV, frequent sinusitis who presents for evaluation of worsening headaches, blurred vision, imbalance, and brain fog. Will check MRI brain as she reports progressively worsening symptoms. While she does not report photophobia or phonophobia, floaters in her vision and allodynia raise suspicion for migraine headaches. Will start Cymbalta for headache prevention and Maxalt for rescue. She does note that she has started drinking energy drinks recently. Advised her to abstain from energy drinks and see if this helps reduce her headache.  PLAN: -Brain MRI -Will check Hgb A1c for paresthesias -Prevention: Start Cymbalta 30 mg daily -Rescue: Start Maxalt 10 mg PRN  I spent a total of 63 minutes chart reviewing and counseling the patient. Headache education was done. Discussed treatment options including preventive and acute medications, and natural supplements. Discussed medication  overuse headache and to limit use of acute treatments to no more than 2 days/week or 10 days/month. Discussed medication side effects, adverse reactions and drug interactions. Written educational materials and patient instructions outlining all of the above were given.  Follow-up: 5 months   Jennifer Chima, MD 04/18/2022   9:58 AM   

## 2022-04-19 ENCOUNTER — Telehealth: Payer: Self-pay | Admitting: Psychiatry

## 2022-04-19 LAB — HEMOGLOBIN A1C
Est. average glucose Bld gHb Est-mCnc: 111 mg/dL
Hgb A1c MFr Bld: 5.5 % (ref 4.8–5.6)

## 2022-04-19 NOTE — Telephone Encounter (Signed)
LVM for patient to call back to schedule  [facility] [place] [phone]

## 2022-04-20 NOTE — Telephone Encounter (Signed)
Patient called back and scheduled for 05/02/22 at 10am at [facility]

## 2022-04-23 ENCOUNTER — Other Ambulatory Visit: Payer: Self-pay | Admitting: Physician Assistant

## 2022-04-23 DIAGNOSIS — R233 Spontaneous ecchymoses: Secondary | ICD-10-CM

## 2022-04-23 DIAGNOSIS — D72820 Lymphocytosis (symptomatic): Secondary | ICD-10-CM

## 2022-04-23 DIAGNOSIS — R718 Other abnormality of red blood cells: Secondary | ICD-10-CM

## 2022-04-23 NOTE — Progress Notes (Unsigned)
[place] [phone]   [phone]  CONSULT NOTE  REFERRING PHYSICIAN: Jarrett Soho Stamey   REASON FOR CONSULTATION:  Lymphocytosis, elevated RBC, and microcytosis   HPI Rose Douglas is a 51 y.o. female with a past medical history significant for depression, reflux, allergies, headaches, and ***was referred to the clinic for abnormal CBC with leukocytosis, slightly elevated RBC and microcytosis.  The patient saw her PCP on 04/11/2022 for the chief complaint of fatigue, easy bruising, shortness of breath, and headaches.  For the headache she is referred to neurology.  She believes this is secondary to sinus.  The patient had a CBC performed which showed normal total white blood cell count at 7.5 with elevated lymphocytes at 45.4% and total absolute lymphocytes elevated at 3.4.  Her neutrophils were within normal limits.  Her hemoglobin was within normal limits at 13 but her RV see was slightly elevated at 5.49.  Her MCV is low at 74.1.  Her platelet count is within normal limits.  B12 was normal at 407.  Her iron studies were within normal limits her ferritin was normal at 119.3, iron was normal at 85, her TIBC was normal at 281 saturation ratio was normal at 30%.  Her ANA was negative rheumatoid factor negative.  Her sed rate was within normal limits.  She was referred to the clinic for further evaluation regarding these findings.  Regarding the elevated RDW count the patient denies any recent dehydration such as nausea, vomiting, diarrhea.  Headaches?  Denies any epistaxis.  Denies any plethora.  She reports fatigue.  She denies any fever, chills, night sweats, or unexplained weight loss.  Denies any palpable lymphadenopathy.  The patient denies any recent or frequent infections.  Denies any steroid use.  ? Denies any herbal supplements or new medications.  Bruising?  Location?  Any other abnormal bleeding or bruising?  Blood thinner?  Family history of sickle  cell?  Heavy metal exposure?  Bloating,  HPI  Past Medical History:  Diagnosis Date   Anxiety    Asthma    no meds   Class 1 obesity due to excess calories in adult    Depression    Frequent headaches    GERD (gastroesophageal reflux disease)    History of stomach ulcers    History of ventricular fibrillation    had total hysterectomy, pt denies   Moderate depressive disorder    Seasonal allergies    Vitamin D deficiency     Past Surgical History:  Procedure Laterality Date   BREAST REDUCTION SURGERY Bilateral 05/18/2020   Procedure: MAMMARY REDUCTION  (BREAST);  Surgeon: Cindra Presume, MD;  Location: [facility];  Service: Plastics;  Laterality: Bilateral;  150 min, please   TOTAL ABDOMINAL HYSTERECTOMY     TUBAL LIGATION      Family History  Problem Relation Age of Onset   Lupus Mother    Early death Mother    Diabetes Maternal Grandmother    Alcohol abuse Maternal Grandfather    Cancer Paternal Grandmother    Cancer Paternal Grandfather    Breast cancer Maternal Aunt     Social History Social History   Tobacco Use   Smoking status: Former    Types: Cigarettes, Cigars   Smokeless tobacco: Never   Tobacco comments:    periodically, has not had any in 2 weeks,04/18/22 casual cigar smoker  Substance Use Topics   Alcohol use: Yes    Comment: weekends  Drug use: Not Currently    Comment: CBD    Allergies  Allergen Reactions   Latex Rash    hives    Current Outpatient Medications  Medication Sig Dispense Refill   butalbital-aspirin-caffeine (FIORINAL) 50-325-40 MG capsule Take 2 capsules by mouth 2 (two) times daily.     DULoxetine (CYMBALTA) 30 MG capsule Take 1 capsule (30 mg total) by mouth daily. 30 capsule 5   levocetirizine (XYZAL) 5 MG tablet Take 5 mg by mouth daily.     pantoprazole (PROTONIX) 40 MG tablet Take 1 tablet (40 mg total) by mouth daily. 90 tablet 0   rizatriptan (MAXALT) 10 MG tablet Take 1 tablet (10 mg total) by  mouth as needed for migraine. May repeat in 2 hours if needed 10 tablet 5   No current facility-administered medications for this visit.    REVIEW OF SYSTEMS:   Review of Systems  Constitutional: Negative for appetite change, chills, fatigue, fever and unexpected weight change.  HENT:   Negative for mouth sores, nosebleeds, sore throat and trouble swallowing.   Eyes: Negative for eye problems and icterus.  Respiratory: Negative for cough, hemoptysis, shortness of breath and wheezing.   Cardiovascular: Negative for chest pain and leg swelling.  Gastrointestinal: Negative for abdominal pain, constipation, diarrhea, nausea and vomiting.  Genitourinary: Negative for bladder incontinence, difficulty urinating, dysuria, frequency and hematuria.   Musculoskeletal: Negative for back pain, gait problem, neck pain and neck stiffness.  Skin: Negative for itching and rash.  Neurological: Negative for dizziness, extremity weakness, gait problem, headaches, light-headedness and seizures.  Hematological: Negative for adenopathy. Does not bruise/bleed easily.  Psychiatric/Behavioral: Negative for confusion, depression and sleep disturbance. The patient is not nervous/anxious.     PHYSICAL EXAMINATION:  There were no vitals taken for this visit.  ECOG PERFORMANCE STATUS: {CHL ONC ECOG [id number]  Physical Exam  Constitutional: Oriented to person, place, and time and well-developed, well-nourished, and in no distress. No distress.  HENT:  Head: Normocephalic and atraumatic.  Mouth/Throat: Oropharynx is clear and moist. No oropharyngeal exudate.  Eyes: Conjunctivae are normal. Right eye exhibits no discharge. Left eye exhibits no discharge. No scleral icterus.  Neck: Normal range of motion. Neck supple.  Cardiovascular: Normal rate, regular rhythm, normal heart sounds and intact distal pulses.   Pulmonary/Chest: Effort normal and breath sounds normal. No respiratory distress. No wheezes. No rales.   Abdominal: Soft. Bowel sounds are normal. Exhibits no distension and no mass. There is no tenderness.  Musculoskeletal: Normal range of motion. Exhibits no edema.  Lymphadenopathy:    No cervical adenopathy.  Neurological: Alert and oriented to person, place, and time. Exhibits normal muscle tone. Gait normal. Coordination normal.  Skin: Skin is warm and dry. No rash noted. Not diaphoretic. No erythema. No pallor.  Psychiatric: Mood, memory and judgment normal.  Vitals reviewed.  LABORATORY DATA: Lab Results  Component Value Date   WBC 5.8 07/15/2020   HGB 13.7 07/15/2020   HCT 44.7 07/15/2020   MCV 79 07/15/2020   PLT 205 07/15/2020      Chemistry      Component Value Date/Time   NA 138 12/11/2019 0909   K 4.4 12/11/2019 0909   CL 104 12/11/2019 0909   CO2 23 12/11/2019 0909   BUN 10 12/11/2019 0909   CREATININE 0.95 12/11/2019 0909      Component Value Date/Time   CALCIUM 9.6 12/11/2019 0909   ALKPHOS 76 12/11/2019 0909   AST 22 12/11/2019 0909   ALT  17 12/11/2019 0909   BILITOT 0.5 12/11/2019 0909       RADIOGRAPHIC STUDIES: No results found.  ASSESSMENT: This is a very pleasant 51 year old female referred to the clinic for lymphocytosis, microcytosis, and slightly elevated white blood cell count.  The patient was seen with Dr. Julien Nordmann today.  The patient had several lab studies performed including a CBC, CMP, LDH, JAK2 mutation testing, flow cytometry, iron studies, ferritin, hemoglobin electrophoresis, and heavy metal panel testing.  The patient CBC from today shows ***.  Her CMP is unremarkable.  Her iron studies are unremarkable.  The rest of her lab results are pending.  Regarding her lymphocytosis, we will arrange for JAK2 mutation testing and flow cytometry to rule out myeloproliferative disorder.  Regarding her slight elevated white blood cell count we will arrange for JAK2 mutation testing.  Regarding her microcytosis, we will also test her for  hemoglobinopathy and heavy metals since her iron studies are within normal limits.  I will call the patient with results.  The patient voices understanding of current disease status and treatment options and is in agreement with the current care plan.  All questions were answered. The patient knows to call the clinic with any problems, questions or concerns. We can certainly see the patient much sooner if necessary.  Thank you so much for allowing me to participate in the care of Ivar Bury. I will continue to follow up the patient with you and assist in her care.  I spent {CHL ONC TIME VISIT - [id number] counseling the patient face to face. The total time spent in the appointment was {CHL ONC TIME VISIT - [id number].  Disclaimer: This note was dictated with voice recognition software. Similar sounding words can inadvertently be transcribed and may not be corrected upon review.   Kayen Grabel L Decklan Mau April 23, 2022, 10:26 AM

## 2022-04-25 ENCOUNTER — Inpatient Hospital Stay: Payer: 59 | Attending: Physician Assistant | Admitting: Physician Assistant

## 2022-04-25 ENCOUNTER — Telehealth: Payer: Self-pay

## 2022-04-25 ENCOUNTER — Other Ambulatory Visit: Payer: Self-pay

## 2022-04-25 ENCOUNTER — Inpatient Hospital Stay: Payer: 59

## 2022-04-25 DIAGNOSIS — R233 Spontaneous ecchymoses: Secondary | ICD-10-CM | POA: Diagnosis not present

## 2022-04-25 DIAGNOSIS — D72820 Lymphocytosis (symptomatic): Secondary | ICD-10-CM | POA: Diagnosis present

## 2022-04-25 DIAGNOSIS — R718 Other abnormality of red blood cells: Secondary | ICD-10-CM | POA: Diagnosis present

## 2022-04-25 DIAGNOSIS — R5383 Other fatigue: Secondary | ICD-10-CM | POA: Diagnosis not present

## 2022-04-25 LAB — CBC WITH DIFFERENTIAL (CANCER CENTER ONLY)
Abs Immature Granulocytes: 0.01 10*3/uL (ref 0.00–0.07)
Basophils Absolute: 0 10*3/uL (ref 0.0–0.1)
Basophils Relative: 0 %
Eosinophils Absolute: 0.2 10*3/uL (ref 0.0–0.5)
Eosinophils Relative: 3 %
HCT: 38.6 % (ref 36.0–46.0)
Hemoglobin: 12.7 g/dL (ref 12.0–15.0)
Immature Granulocytes: 0 %
Lymphocytes Relative: 51 %
Lymphs Abs: 3.4 10*3/uL (ref 0.7–4.0)
MCH: 23.7 pg — ABNORMAL LOW (ref 26.0–34.0)
MCHC: 32.9 g/dL (ref 30.0–36.0)
MCV: 72 fL — ABNORMAL LOW (ref 80.0–100.0)
Monocytes Absolute: 0.4 10*3/uL (ref 0.1–1.0)
Monocytes Relative: 6 %
Neutro Abs: 2.6 10*3/uL (ref 1.7–7.7)
Neutrophils Relative %: 40 %
Platelet Count: 209 10*3/uL (ref 150–400)
RBC: 5.36 MIL/uL — ABNORMAL HIGH (ref 3.87–5.11)
RDW: 14.6 % (ref 11.5–15.5)
WBC Count: 6.7 10*3/uL (ref 4.0–10.5)
nRBC: 0 % (ref 0.0–0.2)

## 2022-04-25 LAB — IRON AND IRON BINDING CAPACITY (CC-WL,HP ONLY)
Iron: 100 ug/dL (ref 28–170)
Saturation Ratios: 42 % — ABNORMAL HIGH (ref 10.4–31.8)
TIBC: 238 ug/dL — ABNORMAL LOW (ref 250–450)
UIBC: 138 ug/dL — ABNORMAL LOW (ref 148–442)

## 2022-04-25 LAB — PLATELET FUNCTION ASSAY
Collagen / ADP: 125 seconds — ABNORMAL HIGH (ref 0–118)
Collagen / Epinephrine: >296 seconds — ABNORMAL HIGH (ref 0–193)

## 2022-04-25 LAB — CMP (CANCER CENTER ONLY)
ALT: 18 U/L (ref 0–44)
AST: 17 U/L (ref 15–41)
Albumin: 3.6 g/dL (ref 3.5–5.0)
Alkaline Phosphatase: 69 U/L (ref 38–126)
Anion gap: 4 — ABNORMAL LOW (ref 5–15)
BUN: 11 mg/dL (ref 6–20)
CO2: 25 mmol/L (ref 22–32)
Calcium: 8.9 mg/dL (ref 8.9–10.3)
Chloride: 107 mmol/L (ref 98–111)
Creatinine: 0.8 mg/dL (ref 0.44–1.00)
GFR, Estimated: >60 mL/min (ref >60)
Glucose, Bld: 96 mg/dL (ref 70–99)
Potassium: 4.1 mmol/L (ref 3.5–5.1)
Sodium: 136 mmol/L (ref 135–145)
Total Bilirubin: 0.3 mg/dL (ref 0.3–1.2)
Total Protein: 6.8 g/dL (ref 6.5–8.1)

## 2022-04-25 LAB — APTT: aPTT: 28 seconds (ref 24–36)

## 2022-04-25 LAB — PROTIME-INR
INR: 0.9 (ref 0.8–1.2)
Prothrombin Time: 12.4 seconds (ref 11.4–15.2)

## 2022-04-25 LAB — FERRITIN: Ferritin: 93 ng/mL (ref 11–307)

## 2022-04-25 LAB — LACTATE DEHYDROGENASE: LDH: 140 U/L (ref 98–192)

## 2022-04-25 NOTE — Telephone Encounter (Signed)
This nurse reached out to patient due to being arrived for appointment and staff unable to locate her in the facility.  Patient stated that she was told by the lab tech that she was all done and she took that to mean that she could leave.   This nurse advised patient that scheduling will reach out to her to reschedule the appointment.  She acknowledged understanding.

## 2022-04-26 LAB — SURGICAL PATHOLOGY

## 2022-04-27 ENCOUNTER — Telehealth: Payer: Self-pay | Admitting: Physician Assistant

## 2022-04-27 LAB — HGB FRACTIONATION CASCADE
Hgb A2: 2.5 % (ref 1.8–3.2)
Hgb A: 97.5 % (ref 96.4–98.8)
Hgb F: 0 % (ref 0.0–2.0)
Hgb S: 0 %

## 2022-04-27 LAB — FLOW CYTOMETRY
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
## 2022-04-27 NOTE — Telephone Encounter (Signed)
R/s pt's new hem appt. Pt is aware of new appt date and time.  °

## 2022-04-27 NOTE — Telephone Encounter (Signed)
.  Called patient to schedule appointment per 6/8 inbasket, patient is aware of date and time.   

## 2022-04-28 LAB — HEAVY METALS, BLOOD
Arsenic: 2 ug/L (ref 0–9)
Lead: <1 ug/dL (ref 0.0–3.4)
Mercury: 1.5 ug/L (ref 0.0–14.9)

## 2022-05-01 ENCOUNTER — Inpatient Hospital Stay: Payer: 59

## 2022-05-01 ENCOUNTER — Other Ambulatory Visit: Payer: Self-pay

## 2022-05-01 ENCOUNTER — Inpatient Hospital Stay (HOSPITAL_BASED_OUTPATIENT_CLINIC_OR_DEPARTMENT_OTHER): Payer: 59 | Admitting: Physician Assistant

## 2022-05-01 VITALS — BP 133/92 | HR 63 | Temp 97.2°F | Resp 17 | Ht 66.5 in | Wt 200.2 lb

## 2022-05-01 DIAGNOSIS — D72820 Lymphocytosis (symptomatic): Secondary | ICD-10-CM

## 2022-05-01 DIAGNOSIS — R233 Spontaneous ecchymoses: Secondary | ICD-10-CM

## 2022-05-01 DIAGNOSIS — R718 Other abnormality of red blood cells: Secondary | ICD-10-CM

## 2022-05-01 LAB — JAK2 (INCLUDING V617F AND EXON 12), MPL,& CALR W/RFL MPN PANEL (NGS)

## 2022-05-02 ENCOUNTER — Ambulatory Visit: Payer: 59

## 2022-05-02 DIAGNOSIS — R519 Headache, unspecified: Secondary | ICD-10-CM

## 2022-05-02 LAB — VON WILLEBRAND PANEL
Coagulation Factor VIII: 140 % (ref 56–140)
Ristocetin Co-factor, Plasma: 99 % (ref 50–200)
Von Willebrand Antigen, Plasma: 156 % (ref 50–200)

## 2022-05-02 LAB — COAG STUDIES INTERP REPORT

## 2022-05-02 MED ORDER — GADOBENATE DIMEGLUMINE 529 MG/ML IV SOLN
15.0000 mL | Freq: Once | INTRAVENOUS | Status: AC | PRN
  Administered 2022-05-02: 15 mL via INTRAVENOUS

## 2022-05-04 ENCOUNTER — Telehealth: Payer: Self-pay

## 2022-05-04 NOTE — Telephone Encounter (Signed)
Called patient regarding the following from [place] :  "Can you call her and let her know that we were checking to make sure she did not have a bleeding disorder (we discussed at the appointment with her) and that her results look normal. She does not meet the criteria for von Willebrand disease. No recommendations needed. She does not need to follow up with [place]. She can continue to follow with her PCP regarding her symptoms of fatigue and neurologist for her headaches."  The patient had no questions or concerns about this. Patient will follow-up with her PCP and neurologist.

## 2022-05-08 ENCOUNTER — Ambulatory Visit: Payer: 59 | Admitting: Family Medicine

## 2022-05-08 ENCOUNTER — Ambulatory Visit: Payer: 59 | Admitting: Psychiatry

## 2022-05-08 ENCOUNTER — Encounter: Payer: 59 | Admitting: Physician Assistant

## 2022-05-08 NOTE — Progress Notes (Deleted)
     HPI: [name] is a 51 y.o. female, who is here today to establish care.  Former PCP: Dr. Marcy Douglas Last preventive routine visit: ***  Chronic medical problems: ***  Concerns today: ***  Review of Systems Rest see pertinent positives and negatives per HPI.  Current Outpatient Medications on File Prior to Visit  Medication Sig Dispense Refill   butalbital-aspirin-caffeine (FIORINAL) 50-325-40 MG capsule Take 2 capsules by mouth 2 (two) times daily.     DULoxetine (CYMBALTA) 30 MG capsule Take 1 capsule (30 mg total) by mouth daily. 30 capsule 5   levocetirizine (XYZAL) 5 MG tablet Take 5 mg by mouth daily.     pantoprazole (PROTONIX) 40 MG tablet Take 1 tablet (40 mg total) by mouth daily. 90 tablet 0   rizatriptan (MAXALT) 10 MG tablet Take 1 tablet (10 mg total) by mouth as needed for migraine. May repeat in 2 hours if needed 10 tablet 5   No current facility-administered medications on file prior to visit.    Past Medical History:  Diagnosis Date   Anxiety    Asthma    no meds   Class 1 obesity due to excess calories in adult    Depression    Frequent headaches    GERD (gastroesophageal reflux disease)    History of stomach ulcers    History of ventricular fibrillation    had total hysterectomy, pt denies   Moderate depressive disorder    Seasonal allergies    Vitamin D deficiency    Allergies  Allergen Reactions   Latex Rash    hives    Family History  Problem Relation Age of Onset   Lupus Mother    Early death Mother    Diabetes Maternal Grandmother    Alcohol abuse Maternal Grandfather    Cancer Paternal Grandmother    Cancer Paternal Grandfather    Breast cancer Maternal Aunt     Social History   Socioeconomic History   Marital status: Single    Spouse name: Not on file   Number of children: 4   Years of education: Not on file   Highest education level: Not on file  Occupational History    Comment: [employer]   Tobacco Use   Smoking status: Former    Types: Cigarettes, Cigars   Smokeless tobacco: Never   Tobacco comments:    periodically, has not had any in 2 weeks,04/18/22 casual cigar smoker  Substance and Sexual Activity   Alcohol use: Yes    Comment: weekends   Drug use: Not Currently    Comment: CBD   Sexual activity: Not on file  Other Topics Concern   Not on file  Social History Narrative   Caffeine- coffee 1 c a day   Social Determinants of Health   Financial Resource Strain: Not on file  Food Insecurity: Not on file  Transportation Needs: Not on file  Physical Activity: Not on file  Stress: Not on file  Social Connections: Not on file    There were no vitals filed for this visit.  There is no height or weight on file to calculate BMI.  Physical Exam  ASSESSMENT AND PLAN:  There are no diagnoses linked to this encounter.  No follow-ups on file.  Betty G. [place], MD  [facility]. [place] office.

## 2022-05-12 ENCOUNTER — Other Ambulatory Visit: Payer: Self-pay | Admitting: Psychiatry

## 2022-06-02 NOTE — Progress Notes (Deleted)
     HPI: [name] is a 51 y.o. female, who is here today to establish care.  Former PCP: Dr. Earlene Plater Last preventive routine visit: ***  Chronic medical problems: ***  Concerns today: ***  Review of Systems Rest see pertinent positives and negatives per HPI.  Current Outpatient Medications on File Prior to Visit  Medication Sig Dispense Refill   butalbital-aspirin-caffeine (FIORINAL) 50-325-40 MG capsule Take 2 capsules by mouth 2 (two) times daily.     DULoxetine (CYMBALTA) 30 MG capsule TAKE 1 CAPSULE BY MOUTH EVERY DAY 90 capsule 2   levocetirizine (XYZAL) 5 MG tablet Take 5 mg by mouth daily.     pantoprazole (PROTONIX) 40 MG tablet Take 1 tablet (40 mg total) by mouth daily. 90 tablet 0   rizatriptan (MAXALT) 10 MG tablet Take 1 tablet (10 mg total) by mouth as needed for migraine. May repeat in 2 hours if needed 10 tablet 5   No current facility-administered medications on file prior to visit.    Past Medical History:  Diagnosis Date   Anxiety    Asthma    no meds   Class 1 obesity due to excess calories in adult    Depression    Frequent headaches    GERD (gastroesophageal reflux disease)    History of stomach ulcers    History of ventricular fibrillation    had total hysterectomy, pt denies   Moderate depressive disorder    Seasonal allergies    Vitamin D deficiency    Allergies  Allergen Reactions   Latex Rash    hives    Family History  Problem Relation Age of Onset   Lupus Mother    Early death Mother    Diabetes Maternal Grandmother    Alcohol abuse Maternal Grandfather    Cancer Paternal Grandmother    Cancer Paternal Grandfather    Breast cancer Maternal Aunt     Social History   Socioeconomic History   Marital status: Single    Spouse name: Not on file   Number of children: 4   Years of education: Not on file   Highest education level: Not on file  Occupational History    Comment: [employer]  Tobacco Use   Smoking  status: Former    Types: Cigarettes, Cigars   Smokeless tobacco: Never   Tobacco comments:    periodically, has not had any in 2 weeks,04/18/22 casual cigar smoker  Substance and Sexual Activity   Alcohol use: Yes    Comment: weekends   Drug use: Not Currently    Comment: CBD   Sexual activity: Not on file  Other Topics Concern   Not on file  Social History Narrative   Caffeine- coffee 1 c a day   Social Determinants of Health   Financial Resource Strain: Not on file  Food Insecurity: Not on file  Transportation Needs: Not on file  Physical Activity: Not on file  Stress: Not on file  Social Connections: Not on file    There were no vitals filed for this visit.  There is no height or weight on file to calculate BMI.  Physical Exam  ASSESSMENT AND PLAN:  There are no diagnoses linked to this encounter.  No follow-ups on file.  Betty G. [place], MD  [facility]. [place] office.

## 2022-06-05 ENCOUNTER — Ambulatory Visit: Payer: 59 | Admitting: Family Medicine

## 2022-09-19 ENCOUNTER — Ambulatory Visit: Payer: 59 | Admitting: Psychiatry

## 2023-05-03 ENCOUNTER — Other Ambulatory Visit: Payer: Self-pay | Admitting: Family Medicine

## 2023-05-03 DIAGNOSIS — Z1231 Encounter for screening mammogram for malignant neoplasm of breast: Secondary | ICD-10-CM
